# Patient Record
Sex: Male | Born: 1960 | Race: White | Hispanic: No | Marital: Married
Health system: Midwestern US, Academic
[De-identification: ages and names within clinical notes are randomized; demographics above are authoritative.]

---

## 2016-11-20 ENCOUNTER — Inpatient Hospital Stay
Admit: 2016-11-20 | Discharge: 2016-11-27 | Disposition: A | Discharge status: Home / Self Care | Payer: MEDICARE | Source: Other Acute Inpatient Hospital

## 2016-11-20 MED ORDER — GADOBENATE DIMEGLUMINE 529 MG/ML (0.1MMOL/0.2ML) IV SOLN
16 mL | Freq: Once | INTRAVENOUS | 0 refills | Status: CP
Start: 2016-11-20 — End: ?

## 2016-11-20 MED ORDER — PROCHLORPERAZINE EDISYLATE 5 MG/ML IJ SOLN
10 mg | INTRAVENOUS | 0 refills | Status: DC | PRN
Start: 2016-11-20 — End: 2016-11-27

## 2016-11-20 MED ORDER — IOPAMIDOL 76 % IV SOLN
100 mL | Freq: Once | INTRAVENOUS | 0 refills | Status: CP
Start: 2016-11-20 — End: ?

## 2016-11-20 MED ORDER — SENNOSIDES-DOCUSATE SODIUM 8.6-50 MG PO TAB
1 | Freq: Two times a day (BID) | ORAL | 0 refills | Status: DC
Start: 2016-11-20 — End: 2016-11-27

## 2016-11-20 MED ORDER — FENTANYL CITRATE (PF) 50 MCG/ML IJ SOLN
25-50 ug | INTRAVENOUS | 0 refills | Status: DC | PRN
Start: 2016-11-20 — End: 2016-11-25

## 2016-11-20 MED ORDER — DOCUSATE SODIUM 100 MG PO CAP
100 mg | Freq: Two times a day (BID) | ORAL | 0 refills | Status: DC
Start: 2016-11-20 — End: 2016-11-27

## 2016-11-20 MED ORDER — SODIUM CHLORIDE 0.9 % IJ SOLN
50 mL | Freq: Once | INTRAVENOUS | 0 refills | Status: CP
Start: 2016-11-20 — End: ?

## 2016-11-20 MED ORDER — ACETAMINOPHEN 325 MG PO TAB
650 mg | ORAL | 0 refills | Status: DC | PRN
Start: 2016-11-20 — End: 2016-11-25

## 2016-11-20 MED ORDER — OXYCODONE 5 MG PO TAB
5-10 mg | ORAL | 0 refills | Status: DC | PRN
Start: 2016-11-20 — End: 2016-11-25

## 2016-11-20 MED ORDER — DIPHENHYDRAMINE HCL 50 MG/ML IJ SOLN
50 mg | Freq: Once | INTRAVENOUS | 0 refills | Status: CP
Start: 2016-11-20 — End: ?

## 2016-11-20 MED ORDER — LISINOPRIL 20 MG PO TAB
20 mg | Freq: Every day | ORAL | 0 refills | Status: DC
Start: 2016-11-20 — End: 2016-11-26

## 2016-11-20 MED ORDER — PROCHLORPERAZINE EDISYLATE 5 MG/ML IJ SOLN
10 mg | Freq: Once | INTRAVENOUS | 0 refills | Status: CP
Start: 2016-11-20 — End: ?

## 2016-11-20 MED ORDER — MAGNESIUM HYDROXIDE 2,400 MG/10 ML PO SUSP
10 mL | Freq: Every day | ORAL | 0 refills | Status: DC
Start: 2016-11-20 — End: 2016-11-27
  Administered 2016-11-27: 13:00:00 10 mL via ORAL

## 2016-11-20 MED ORDER — MAGNESIUM SULFATE IN D5W 1 GRAM/100 ML IV PGBK
1 g | Freq: Once | INTRAVENOUS | 0 refills | Status: CP
Start: 2016-11-20 — End: ?

## 2016-11-20 NOTE — H&P (View-Only)
[facility]       History and Physical Note  _____________________________________________________________________    Admission Date: 11/20/2016                                                LOS: 0 days    Assessment/Plan:  Todd Fischer is a 56 y.o. male with HTN, HLD, COPD, 30 pack year smoking history presents as transfer with CT demonstrating right frontal mass and with left facial droop and reported confusion from family.    - no acute neurosurgical intervention  - admitted to floor  - MRI head W/WO to better characterize lesion  - CT c/a/p for possible primary tumor location  - q4 neuro checks  - discussed with senior resident  - ongoing planning depending imaging results    Ardeth Sportsman, MD    Please call Neurosurgery Call Pager [phone]) with questions.  _____________________________________________________________________    History of Present Illness:   Todd Fischer is a 56 y.o. male with hypertension, hyperlipidemia, COPD, and a 30-pack-year smoking history presents as a transfer from outside hospital.  Patient presented outside hospital with complaints of headache, left facial droop, and reports by family of being increasingly confused over the last month.  Outside hospital obtained CT head which demonstrated a right frontal mass.  Patient was then transferred to  for further care management. Patient reports that his headache started about 1 month ago, diffuse throughout his head.  Patient reports that he did not notice the left facial droop until 1-2 weeks ago.  Patient has also noticed within the last week and a slight difficulty with swallowing and drooling.  Per family, patient has been more forgetful last month forgetting to close the fridge and other daily tasks that the patient does not normally forget.  Patient denies any nausea, vomiting, numbness, weakness, tingling, or change in vision.      Past Medical History:  Past Medical History:   Diagnosis Date ??? Symptoms Involving Digestive System     reflux   ??? Unspecified Disease of Respiratory System     asthma/chronic bronchitis       Past Surgical History:  Past Surgical History:   Procedure Laterality Date   ??? HX TONSILLECTOMY         Social History:  Social History   Substance Use Topics   ??? Smoking status: Not on file   ??? Smokeless tobacco: Not on file   ??? Alcohol use Not on file       Family History:  No family history on file.    Allergies:    Patient has no known allergies.    Medications:  PTA:  Prescriptions Prior to Admission   Medication Sig   ??? fluticasone/salmeterol (ADVAIR DISKUS) 100/50 mcg inhalation disk RT Twice Daily    ??? hydrocodone-acetaminophen (LORTAB 10/500) 10-500 mg per tablet 2 tabs every 6 hours prn pain       Inpatient:  Scheduled Meds:  docusate (COLACE) capsule 100 mg 100 mg Oral BID   milk of magnesia (CONC) oral suspension 10 mL 10 mL Oral QDAY   senna/docusate (SENOKOT-S) tablet 1 tablet 1 tablet Oral BID   Continuous Infusions:  PRN and Respiratory Meds:acetaminophen Q4H PRN, fentaNYL citrate PF Q2H PRN, oxyCODONE Q4H PRN, prochlorperazine Q6H PRN      Review of Systems:  Full 12 point  review of systems negative except as noted in HPI.    Physical Exam:  Vital Signs:  Last Filed in 24 hours Vital Signs:  24 hour Range    BP: 197/100 (05/25 1559)  Temp: 36.2 ???C (97.2 ???F) (05/25 1559)  Pulse: 54 (05/25 1620)  Respirations: 18 PER MINUTE (05/25 1620)  SpO2: 99 % (05/25 1620)  O2 Delivery: None (Room Air) (05/25 1559)  Height: 182.9 cm (72) (05/25 1500) BP: (197)/(100)   Temp:  [36.2 ???C (97.2 ???F)]   Pulse:  [54-56]   Respirations:  [18 PER MINUTE]   SpO2:  [99 %]   O2 Delivery: None (Room Air)     General appearance: No acute distress  Lungs: Respirations non-labored  Heart: Regular rate and rhythm  Abdomen: Soft, non-tender. Non-distended  Extremities: No edema, redness or tenderness in the calves or thighs    Neurologic Exam: Mental Status: Awake, alert and oriented x 4, fluent speech, normal cognition  Pupils: Pupils equal round and reactive to light  Cranial Nerves: left lower facial droop, otherwise CN II-XII individually tested and found to be intact, gag not tested  Motor:   AA EF EE FA G HF KF KE DF PF EHL  Left 5 5 5 5 5 5 5 5 5 5 5    Right 5 5 5 5 5 5 5 5 5 5 5    Normal muscle bulk and tone  No pronator drift  Sensation: Sensation intact to light touch  Deep Tendon Reflexes:  2+ throughout  Plantar responses toes downgoing bilaterally  No clonus bilaterally  No hoffman's sign bilaterally  Gait: Normal gait  Cerebellar: No dysmetria, No dysdiadochokinesia      LabTests:  Hematology:    Lab Results   Component Value Date    HGB 15.4 11/20/2016    HCT 44.9 11/20/2016    PLTCT 207 11/20/2016    WBC 10.6 11/20/2016    NEUT 68 11/20/2016    ANC 7.20 11/20/2016    ALC 2.30 11/20/2016    MONA 9 11/20/2016    AMC 0.90 11/20/2016    ABC 0.00 11/20/2016    MCV 90.6 11/20/2016    MCHC 34.2 11/20/2016    MPV 8.6 11/20/2016    RDW 13.3 11/20/2016     General Chemistry: No results found for: NA, K, CL, CO2, BUN, CR, GLU, OBSCA, CA, MG, PO4, FREEPHENY      Radiology and other Diagnostics Review:    CT head reveiwed demonstrating right frontal mass.

## 2016-11-20 NOTE — Progress Notes
Pt arrived from [place]. Pt is alert and oriented x4. Able to ambulate with standby assist. Pt is weak to LUE only. Marland Kitchen Pt placed on high fall risk due to new diagnosis of brain mass. Pt is NPO per dr's orders. Pt oriented to room. Will cont. To monitor

## 2016-11-20 NOTE — Progress Notes
RESPIRATORY THERAPY  ADULT PROTOCOL EVALUATION      RESPIRATORY PROTOCOL PLAN    Medications       Note: If indicated by protocol, medication orders will be placed by therapist.    Procedures  PEP Therapy: Place a nursing order for "IS Q1h While Awake" for any of Lung Expansion indicators       _____________________________________________________________    PATIENT EVALUATION RESULTS    Chart Review  * Pulmonary Hx: Smoking cessation < 8 weeks OR still smoking OR > 20 pack/yr hx (PEFR)_____ OR occasional use of bronchodilator (AM) (current smoker)    * Surgical Hx: No surgery OR last surgery > 6 weeks ago OR trach/stoma (BA)    * Chest X-Ray: Clear OR not available (pending)    * PFT/Oxygenation: FEV1, PEFR > 80% predicted OR physically unable to perform OR Pa02 >80 RA OR Sp02 >95% RA (SpO2=100% on RA)      Patient Assessment  * Respiratory Pattern: Regular pattern and rate OR good chest excursion with deep breathing    * Breath Sounds: Clear apically, but diminished in bases (LE) OR CHF related crackles (02) (oximetry)    * Cough / Sputum: Strong, effective cough OR nonproductive    * Mental Status: Alert, oriented, cooperative    * Activity Level: Ambulatory with assistance      Priority Index  Total Points: 4 Points  * Priority Index: Criteria not met    PRIORITY INDEX GUIDELINES*  Priority Points   1 0-9 points   2 9-18 points   3 > 18 points   + Pulm Dx or Home Rx   *Higher points indicate higher acuity.      Therapist: Flint Melter, RT  Date: 11/20/2016      Key  AC = Airway clearance  AM = Aerosolized medication  BA = Bland aerosol  DB&C = Deep breathe & cough  FEV1 = Forced expiratory volume in first second)  IC = Inspiratory capacity  LE = Lung expansion  MDI = Metered dose inhaler  Neb = Nebulizer  O2 = Oxygen  Oxim =Oximetry  PEFR = Peak expiratory flow rate  RRT = Rapid Response Team

## 2016-11-21 MED ORDER — DEXAMETHASONE SODIUM PHOSPHATE 4 MG/ML IJ SOLN
4 mg | INTRAVENOUS | 0 refills | Status: DC
Start: 2016-11-21 — End: 2016-11-26

## 2016-11-21 NOTE — Consults
Hematology/Oncology Consult Note          Name:  Todd Fischer                                             MRN:  [id number]   Admission Date:  11/20/2016             Impression and Recommendations    Todd Fischer is a 56 y.o. male with past medical history of current and heavy tobacco use, COPD, previous alcohol use who was transferred from outside hospital for further up.     1. 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 Indeterminate 3cm liver lesion  3. RUL nodule 1cm with mild hilar adenopathy    DDx: primary brain neoplasm like GBM vs metastasis from a possible lung primary SCLC vs NSCLC vs melanoma vs other differential    Recommendations  Would recommend MRI abdomen and AFP to further work up the liver lesion.   We could obtain PET and EBUS for the lung nodule and hilar adenopathy work up but we feel that his frontal lobe lesion is quite big with associated midline shift, which will likely to be managed better with surgical resection. So if you plan to proceed with surgical resection, we can get pathology from their and develop a plan moving forward,   I discussed these recommendations with resident on call with neurosurgery.    Patient was seen and discussed with Dr. Raleigh Callas.     Joycelyn Man MBBS  Pager: [phone]  Fellow/Hematology and Medical Oncology  [facility]    ___________________________________________________________________________  Reason for consult: Brain mass     HISTORY OF PRESENT ILLNESS: Todd Fischer is a 56 y.o. male with past medical history of current and heavy tobacco use, COPD, previous alcohol use who was transferred from outside hospital for further up.     Patient presented to outside hospital with complaints of headache, left facial droop, and increasing confusion over the course of the last month.  Outside hospital obtained CT head which demonstrated a right frontal mass. Patient was then transferred to [facility] for further care management. His sister is present at bedside and is taking the lead on answering questions. Patient appreciated my presence but has difficulty expressing himself and motioned his sister to respond to mu questions multiple times. Sister told us that the patient has not been feeling at the baseline for about 1 month now. Initially he had facial drooping so he was diagnosed with Bell's palsy and started on steroids. During this process, they were seen by a neurologist as well who recommended MRI but that was not done yet. Patine then developed a headache and then family noticed that he was mote confused. He has been more forgetful last month as well. They noticed some personality changes as well.     He was transferred to [facility] where MRI showed a large 5.1cm R frontal lobe mass with associated midline shift of about .5cm.     he has significant smoking hx. He still smokes. 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 ???No history of any infectious hepatitis that he or she knows about. Only family hx of cancer is his father who died of lung cancer but he was a heavy smoker as well.  PMH:  Past Medical History:   Diagnosis Date   ??? Symptoms Involving Digestive System     reflux   ??? Unspecified Disease of Respiratory System     asthma/chronic bronchitis        PSH:  Past Surgical History:   Procedure Laterality Date   ??? HX TONSILLECTOMY          SOCIAL HISTORY:  Social History     Social History   ??? Marital status: Married     Spouse name: N/A   ??? Number of children: N/A   ??? Years of education: N/A     Social History Main Topics   ??? Smoking status: Not on file   ??? Smokeless tobacco: Not on file   ??? Alcohol use Not on file   ??? Drug use: Unknown   ??? Sexual activity: Not on file     Other Topics Concern   ??? Not on file     Social History Narrative   ??? No narrative on file        FAMILY HISTORY:  No family history on file.     IMMUNIZATIONS: There is no immunization history on file for this patient.        ALLERGIES:  Patient has no known allergies.    HOME MEDICATIONS:  Prescriptions Prior to Admission   Medication Sig   ??? aspirin EC 81 mg tablet Take 81 mg by mouth at bedtime daily. Take with food.   ??? diphenhydrAMINE (BENADRYL ALLERGY) 25 mg tablet Take 25 mg by mouth at bedtime as needed.   ??? guaiFENesin LA (MUCINEX) 600 mg tablet Take 600 mg by mouth at bedtime daily.   ??? krill/om-3/dha/epa/phospho/ast (MEGARED OMEGA-3 KRILL OIL PO) Take 1 capsule by mouth daily.   ??? lisinopril (PRINIVIL; ZESTRIL) 20 mg tablet Take 20 mg by mouth at bedtime daily.   ??? ranitidine (ZANTAC) 75 mg tablet Take 75 mg by mouth at bedtime daily.   ??? simvastatin (ZOCOR) 40 mg tablet Take 40 mg by mouth at bedtime daily.   ??? vitamins, multiple (MULTIPLE VITAMINS) tablet Take 1 tablet by mouth at bedtime daily.        ROS:  ??? Could not be obtained due to patient factors       PHYSICAL EXAM:  Vital Signs: Last Filed In 24 Hours   BP: 146/85 (05/26 0822)  Temp: 36.7 ???C (98 ???F) (05/26 5188)  Pulse: 89 (05/26 0822)  Respirations: 18 PER MINUTE (05/26 0822)  SpO2: 98 % (05/26 0822)  O2 Delivery: None (Room Air) (05/26 4166)  Height: 182.9 cm (72) (05/25 1500)     ??? General: Awake, in no acute distress, long word finding pauses and then motions his sister to answer the questions .  ??? HEENT: NC/AT, sclera non-icteric, moist mucus membranes, clear oropharynx  ??? Neck: no JVD, no carotid bruit  ??? Lymph Nodes: Not palpable   ??? CV: regular rhythm, normal rate, no murmur/rub/gallop  ??? Lungs: clear to ausculation bilaterally, no wheezes/rhonchi/crackles  ??? Abdomen: soft, non-tender, non-distended, normo-active bowel sounds  ??? Hepatosplenomegaly: No  ??? Extremities: no edema, pedal pulses 2 (+) bilaterally  ??? Neuro: no focal deficits  ??? Skin: no rashes    LABS:  Recent CBC   Recent Labs      11/20/16   1715   WBC  10.6   HGB  15.4   HCT  44.9   PLTCT  207   MCV  90.6  INR  1.1 Recent CMP   Recent Labs      11/20/16   1715   NA  135*   K  4.0   CL  104   CO2  19*   GAP  12   CR  1.02   GFR  >60   GLU  109*   CA  10.4          RADIOLOGY:    Pertinent radiology reviewed.    Joycelyn Man MBBS  Pager: [phone]  Fellow/Hematology and Medical Oncology  [facility]

## 2016-11-21 NOTE — Progress Notes
Neurosurgery Progress Note      Admission Date: 11/20/2016 LOS: 0 days  ______________________________________________________________    S: No acute events noted  Continued work up discussed with patient and sister.         O:                  Vital Signs: 24 Hour Range   BP: (132-197)/(79-111)   Temp:  [36.2 C (97.2 F)-37.1 C (98.8 F)]   Pulse:  [52-96]   Respirations:  [18 PER MINUTE]   SpO2:  [98 %-99 %]   O2 Delivery: (P) None (Room Air)     Physical Exam:  Alert and Oriented  Approrpriate conversation  Left facial droop  Right strength 5/5 throughout  Left 4/5 throughout        A/P: 56 y.o. male   Active Problems:    Brain mass  [employer] is a 56 y.o. male with HTN, HLD, COPD, 30 pack year smoking history presents as transfer with CT demonstrating right frontal mass and with left facial droop and reported confusion from family.    MRI brian done - reviewed  CT c/a/p - fluid filled bullous noted - recommended aspiration with analysis   Right upper lobe nodule - recommend PET or short term CT follow up 2-3 months  Right liver lesion - MRI recommended  Plan to consult ONc for further work up assistance  Will talk to IR as well  Continue rest of current care for now  Dispo - pending     Prophylaxis:   B) Lines:  No  C) Urinary Catheter:  No  D) Antibiotic Usage:  No  E) VTE:  Mechanical prophylaxis; Sequential compression device  F) Restraints: Patient assessed for need for restraints.     Please call [phone] with any questions.    Bernerd Limbo, MD  Pager [phone]

## 2016-11-22 MED ORDER — MAGNESIUM SULFATE IN D5W 1 GRAM/100 ML IV PGBK
1 g | Freq: Once | INTRAVENOUS | 0 refills | Status: CP
Start: 2016-11-22 — End: ?

## 2016-11-22 MED ORDER — LABETALOL 5 MG/ML IV SYRG
10 mg | Freq: Once | INTRAVENOUS | 0 refills | Status: CP
Start: 2016-11-22 — End: ?

## 2016-11-22 MED ORDER — HYDRALAZINE 20 MG/ML IJ SOLN
10 mg | INTRAVENOUS | 0 refills | Status: DC | PRN
Start: 2016-11-22 — End: 2016-11-25

## 2016-11-22 MED ORDER — DIPHENHYDRAMINE HCL 50 MG/ML IJ SOLN
50 mg | Freq: Once | INTRAVENOUS | 0 refills | Status: CP
Start: 2016-11-22 — End: ?

## 2016-11-22 MED ORDER — GADOXETATE 2.5 MMOL/10 ML (181.43 MG/ML) IV SOLN
10 mL | Freq: Once | INTRAVENOUS | 0 refills | Status: CP
Start: 2016-11-22 — End: ?

## 2016-11-22 MED ORDER — LABETALOL 5 MG/ML IV SYRG
10 mg | INTRAVENOUS | 0 refills | Status: DC | PRN
Start: 2016-11-22 — End: 2016-11-25

## 2016-11-22 MED ORDER — SODIUM CHLORIDE 0.9 % IJ SOLN
50 mL | Freq: Once | INTRAVENOUS | 0 refills | Status: CP
Start: 2016-11-22 — End: ?

## 2016-11-22 NOTE — Progress Notes
PHYSICAL THERAPY  NOTE     Physical therapy orders received and appreciated. Chart reviewed and discussed patient status with bedside RN. Patient currently ambulating in room with family members (SBA). Per RN, plan for potential surgery on Wednesday 05/30. Physical therapy will follow up post-operatively and provide intervention as indicated.     Therapist: Talmadge Coventry, PT  Date: 11/22/2016

## 2016-11-22 NOTE — Progress Notes
Text paged neurosurgery regarding pt's blood pressure. Pt asymptomatic. Pt has no pain. No PRN HTN meds to give.

## 2016-11-22 NOTE — Progress Notes
Neurosurgery Progress Note      Admission Date: 11/20/2016 LOS: 2 days  ______________________________________________________________    S: No acute events noted, family feels speech has improved with Dex.   Discussed plan with family. Will review case with Dr. Enrique Sack.         O:                  Vital Signs: 24 Hour Range   BP: (144-175)/(77-101)   Temp:  [36.4 C (97.5 F)-36.7 C (98 F)]   Pulse:  [72-95]   Respirations:  [18 PER MINUTE-20 PER MINUTE]   SpO2:  [96 %-99 %]   O2 Delivery: None (Room Air)     Physical Exam:  Alert and Oriented  Approrpriate conversation  Left facial droop  Right strength 5/5 throughout  Left 4/5 throughout        A/P: 56 y.o. male   Active Problems:    Brain mass  Todd Fischer is a 56 y.o. male with HTN, HLD, COPD, 30 pack year smoking history presents as transfer with CT demonstrating right frontal mass and with left facial droop and reported confusion from family.    MRI brian done - reviewed  CT c/a/p - fluid filled bullous noted - recommended aspiration with analysis (hold for now as asymptomatic - will consult IR on Tuesday)   Right upper lobe nodule - recommend PET or short term CT follow up 2-3 months  Right liver lesion - MRI ordered   Onc following - appreciate recs   Continue rest of current care for now  Dispo - pending     Prophylaxis:   B) Lines:  No  C) Urinary Catheter:  No  D) Antibiotic Usage:  No  E) VTE:  Mechanical prophylaxis; Sequential compression device  F) Restraints: Patient assessed for need for restraints.     Please call [phone] with any questions.    Bernerd Limbo, MD  Pager [phone]

## 2016-11-22 NOTE — Progress Notes
[id number]  Neurosurgery notified of elevated BP and request for migraine cocktail.  Order received for cocktail and to monitor BP.

## 2016-11-22 NOTE — Progress Notes
OCCUPATIONAL THERAPY  NOTE     OT orders received and appreciated. Chart reviewed and discussed status with RN. Patient currently ambulating in room with family members. Per RN, plan for potential surgery on Wednesday 05/30. OT will follow up post-operatively.      Therapist: Arrie Aran, OTR/L [id number]  Date: 11/22/2016

## 2016-11-23 MED ORDER — HEPARIN, PORCINE (PF) 5,000 UNIT/0.5 ML IJ SYRG
5000 [IU] | SUBCUTANEOUS | 0 refills | Status: DC
Start: 2016-11-23 — End: 2016-11-23

## 2016-11-23 MED ORDER — HEPARIN, PORCINE (PF) 5,000 UNIT/0.5 ML IJ SYRG
5000 [IU] | SUBCUTANEOUS | 0 refills | Status: CP
Start: 2016-11-23 — End: ?

## 2016-11-23 MED ORDER — MAGNESIUM SULFATE IN D5W 1 GRAM/100 ML IV PGBK
1 g | Freq: Once | INTRAVENOUS | 0 refills | Status: CP
Start: 2016-11-23 — End: ?

## 2016-11-23 MED ORDER — DIPHENHYDRAMINE HCL 50 MG PO CAP
50 mg | Freq: Once | ORAL | 0 refills | Status: CP
Start: 2016-11-23 — End: ?

## 2016-11-23 NOTE — Progress Notes
11/23/16 1248   Vitals   Temp 36.7 C (98.1 F)   Temperature Source Oral   Pulse 86   Respirations 18 PER MINUTE   SpO2 98 %   O2 Delivery RA   BP (!) 181/99   Mean NBP (Calculated) 126 MM HG   BP Source Arm, Left   BP Patient Position HOB   1245: BP 181/99 - 10 mg labetalol given.  1350: BP 172/99 - 10 mg hydralazine given  1440: BP 164/86  Notified MD Alease Medina of above, no new orders given at this time. Will continue to monitor.

## 2016-11-23 NOTE — Progress Notes
Neurosurgery Progress Note      Admission Date: 11/20/2016 LOS: 3 days  ______________________________________________________________    S: No acute events noted, doing well this AM.  speech still improved.  Discussed plan for OR on Wednesday.  Will meet Dr. Enrique Sack tomorrow.          O:                  Vital Signs: 24 Hour Range   BP: (139-183)/(72-93)   Temp:  [36.2 C (97.1 F)-36.7 C (98 F)]   Pulse:  [63-82]   Respirations:  [17 PER MINUTE-18 PER MINUTE]   SpO2:  [98 %-100 %]   O2 Delivery: None (Room Air)     Physical Exam:  Alert and Oriented  Approrpriate conversation  Left facial droop  Right strength 5/5 throughout  Left 4/5 throughout        A/P: 56 y.o. male   Active Problems:    Brain mass  Todd Fischer is a 56 y.o. male with HTN, HLD, COPD, 30 pack year smoking history presents as transfer with CT demonstrating right frontal mass and with left facial droop and reported confusion from family.    MRI brian done - reviewed  CT c/a/p - fluid filled bullous noted - recommended aspiration with analysis (hold for now as asymptomatic - will consult IR on Tuesday)   Right upper lobe nodule - recommend PET or short term CT follow up 2-3 months  Right liver lesion - MRI done no read yet  Onc following - appreciate recs   Continue rest of current care for now  Plan for OR Wednesday awake crani   Dispo - pending     Prophylaxis:   B) Lines:  No  C) Urinary Catheter:  No  D) Antibiotic Usage:  No  E) VTE:  Mechanical prophylaxis; Sequential compression device  F) Restraints: Patient assessed for need for restraints.     Please call [phone] with any questions.    Bernerd Limbo, MD  Pager [phone]

## 2016-11-24 MED ORDER — ALPRAZOLAM 0.25 MG PO TAB
0.25 mg | ORAL | 0 refills | Status: DC | PRN
Start: 2016-11-24 — End: 2016-11-26

## 2016-11-24 MED ORDER — AMLODIPINE 5 MG PO TAB
5 mg | Freq: Every day | ORAL | 0 refills | Status: DC
Start: 2016-11-24 — End: 2016-11-26

## 2016-11-24 NOTE — Progress Notes
SPEECH-LANGUAGE PATHOLOGY  COGNITIVE ASSESSMENT     EVALUATION SUMMARY  Montreal Cognitive Assessment [facility]) completed this date. 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 Areas of difficulty included:delayed recall/memory and attention. Pt additionally noted to have increased processing time and flat affect. Pt's sister-in-law present at time of visit. Pt/family both report changes to cognition at this time. Please see further details below.     RECOMMENDATIONS:  Will f/u with pt Thursday or Friday following planned crani tomorrow. At this time would recommend ongoing speech therapy services to address cognitive-communicative changes. Intermittent assist w/ finance & medication management and meal preparation upon discharge secondary to patient's impaired attention and memory.      PRAGMATICS   Comments*: Appropriate eye contact with flat affect.    BEHAVIOR  Comments*:Increased processing time and need for repetition of information noted.     AUDITORY COMPREHENSION   Comments*: No focal language deficits noted.     MOCA-Montreal Cognitive Assessment  Visuospatial/executive: 4/5  Naming:3/3  Memory/Delayed Recall:5/5 with cues immediately and 4/5 with a delay.   Attention: 3/6  Language:3/3  Abstraction: 2/2  Orientation:6/6    Objective*  Todd Fischer???is a 56 y.o.???male???with HTN, HLD, COPD, 30 pack year smoking history presents as transfer with CT demonstrating right frontal mass and with left facial droop and reported confusion from family.    MRI head:  IMPRESSION  Redemonstration of the large right frontal lobe mass with small areas of   hemorrhage, peripheral enhancement and central necrosis. This results in   mild right to left midline shift.    Handedness: Right  Hearing: WFL  Education Level: 9th grade  Lives With: wife  Receives Help From: wife; has been on disability   Psychosocial Status: Willing and Cooperative to Participate  Persons Present: Sister-in-law    Subjective* Pain: Patient has no complaint of pain  Pain Level Current*: No pain  Feeding Tube Present During Eval: None    Education*  Persons Educated: Equities trader  Barriers To Learning: memory changes  Interventions: Repetition of Instructions  Teaching Methods: Verbal  Topics: Memory  Patient Response: Verbalized Understanding  Goal Formulation: With Patient    Pt will participate in an ongoing cognitive evaluation with moderate cues.     G-Codes:Memory   G 9168 Current Status:20-39% Impairment  G 9169 Goal Status:20-39% Impairment      Based on above evaluation and clinical judgment.       Therapist: Cheryl Flash - M.S. CCC-L/SLP   [id number]  [id number]  Date: 11/24/2016

## 2016-11-24 NOTE — Progress Notes
11/24/16 1255   Vitals   Temp 36.6 C (97.9 F)   Temperature Source Oral   Pulse 62   Respirations 18 PER MINUTE   SpO2 98 %   O2 Delivery RA   BP (!) 189/92   Mean NBP (Calculated) 124 MM HG   BP Source Arm, Right   BP Patient Position HOB     APRN Labelle notified of above.  Pt very anxious regarding surgery tmrw and would like "something to calm him down".  APRN to place orders.  APRN also asked this RN to not give hydralazine, to give anxiety meds and then recheck BP.  Will continue to monitor.

## 2016-11-24 NOTE — Progress Notes
Neurosurgery Progress Note      Admission Date: 11/20/2016 LOS: 4 days  ______________________________________________________________    S: Doing well this AM, family at bedside, seen with Dr. Enrique Sack, surgical plan discussed, questions answered. Patient in agreement with awake crani.        O:                  Vital Signs: 24 Hour Range   BP: (135-181)/(70-99)   Temp:  [36.4 C (97.6 F)-36.7 C (98.1 F)]   Pulse:  [62-91]   Respirations:  [18 PER MINUTE-20 PER MINUTE]   SpO2:  [96 %-99 %]   O2 Delivery: None (Room Air)     Physical Exam:  Alert and Oriented  Approrpriate conversation  Left facial droop, with drooling  Right strength 5/5 throughout  Left 4/5 throughout x grip 3+/5        A/P: 56 y.o. male   Active Problems:    Brain mass  Arthur Speagle is a 56 y.o. male with HTN, HLD, COPD, 30 pack year smoking history presents as transfer with CT demonstrating right frontal mass and with left facial droop and reported confusion from family.    MRI brian done - reviewed  CT c/a/p - fluid filled bullous noted - Right upper lobe nodule - recommend PET or short term CT follow up 2-3 months  Right liver lesion - MRI read pending  Onc following - appreciate recs, anticipate need for PET prior to discharge  Continue rest of current care for now  Plan for OR 5/30 awake crani   Dispo - pending     Prophylaxis:   B) Lines:  No  C) Urinary Catheter:  No  D) Antibiotic Usage:  No  E) VTE:  Mechanical prophylaxis; Sequential compression device  F) Restraints: Patient assessed for need for restraints.     Please call [phone] with any questions.    Delfin Gant, APRN  Pager [phone]

## 2016-11-24 NOTE — Case Management (ED)
** NCM assisting primary NCM Allegra with discharge planning. Please contact primary NCM for any additional assistance. Thank you.**    Case Management Admission Assessment    NAME:Todd Fischer                          MRN: [id number]             DOB:12-Oct-1960          AGE: 56 y.o.  ADMISSION DATE: 11/20/2016             DAYS ADMITTED: LOS: 4 days      Today???s Date: 11/24/2016    Source of Information: Patient, Other Sister-in-Law Debra and EMR    Plan  Plan: CM Assessment, Assist PRN with SW/NCM Services  Per H&P, 56 y.o. male with HTN, HLD, COPD, 30 pack year smoking history presents as transfer with CT demonstrating right frontal mass and with left facial droop and reported confusion from family.Plan for OR 5/30 awake crani.  Patient is laying in bed and resting comfortably at time of NCM assessment; agreeable to completing assessment at this time.   ??? Met with patient at bedside; explained role of NCM and case management's role in discharge planning; provided patient with NCM contact information.   ??? Patient lives in a 1 story home with his wife. Pt reports that his wife is an opiate nurse. Pt also reports that his wife was just released from the hospital yesterday following a procedure at a different hospital.   ??? Patient denies previous HH, LTACH, SNF, IPR, DME experience, and is unsure of whether he will require [facility] services upon discharge.   ??? Neurosurgery CM team to continue to follow patient's POC via EMR and primary team huddle; will await final therapy recommendations post-op; will assist with discharge planning needs as indicated.      Patient Address/Phone  [address]  [place] [address]  [phone] (home)     Emergency Contact  Extended Emergency Contact Information  Primary Emergency Contact: Manalo,Darlene  Address: [address]           [place], [place] [address]  Home Phone: [phone]  Relation: Spouse  Secondary Emergency Contact: Marvell Fuller  Address: [place] Home Phone: [phone]  Relation: Relative    Healthcare Directive   Denies      Transportation  Does the patient need discharge transport arranged?: No  Transportation Name, Phone and Availability #1: Pt's wife will drive him home  Does the patient use Medicaid Transportation?: No    Primary Emergency Contact: Laiche,Darlene, Home Phone: [phone] will drive patient home upon discharge.     Expected Discharge Date  Expected Discharge Date: 11/27/16  ??? Expected discharge date pending patient's medical stability; awaiting surgical procedure tomorrow.     Living Situation Prior to Admission  ? Living Arrangements  Type of Residence: Home, independent  Living Arrangements: Spouse/significant other  Financial risk analyst / Tub: Tub/Shower Unit  How many levels in the residence?: 1 (2 steps)  Can patient live on one level if needed?: Yes  Does residence have entry and/or side stairs?: Yes (2 steps for entry)  Assistance needed prior to admit or anticipated on discharge:  (Unknown)  Who provides assistance or could if needed?: Pt's wife is a Engineer, civil (consulting); per sister in law, pt has adequate support systems in place to help him at home.   Are they in good health?: Unknown  Can support system provide  24/7 care if needed?: Maybe  ? Level of Function   Prior level of function: Independent   ? Cognitive Abilities   Cognitive Abilities: Alert and Oriented, Participates in decision making, Recognizes impact of health condition on lifestyle, Continue to Assess    Financial Resources  ? Coverage  Primary Insurance: Medicare Replacement ([employer] Medicare)  Secondary Insurance: No insurance  Additional Coverage: RX      [facility] [place], [place] - [address] [place] [address]  Phone: [phone] Fax: [phone]    Payor: Francine Graven MEDICARE / Plan: HU[employer] CHOICE PPO / Product Type: Medicare /   ? Source of Income   Source Of Income: Unemployed, Other (comment) (Pt is on long-term disability.)  ? Financial Assistance Needed? Denies need for financial assistance including Rx coverage    Psychosocial Needs  ? Mental Health  Mental Health History: No  ? Substance Use History  Substance Use History Screen: No  ? Other  - Pt would like a consult placed to the smoking cessation team; primary NCM notified.     Current/Previous Services  ? PCP  Fawn Kirk, [phone], [phone]  ? Pharmacy    [facility] [address] [place] - [address] [place] [address] [place] [address]  [place] [address]  Phone: [phone] Fax: [phone]    ? Durable Medical Equipment   Durable Medical Equipment at home: Nebulizer  ? Home Health  Receiving home health: No  ? Hemodialysis or Peritoneal Dialysis  Undergoing hemodialysis or peritoneal dialysis: No  ? Tube/Enteral Feeds  Receive tube/enteral feeds: No  ? Infusion  Receive infusions: No  ? Private Duty  Private duty help used: No  ? Home and Community Based Services  Home and community based services: No  ? [employer] Supply: N/A  ? Hospice  Hospice: No  ? Outpatient Therapy  PT: No  OT: No  SLP: No  ? Skilled Nursing Facility/Nursing Home  SNF: No  NH: No  ? Inpatient Rehab  IPR: No  ? Long-Term Acute Care Hospital  LTACH: No  ? Acute Hospital Stay  Acute Hospital Stay: No      Barrie Lyme BSN, RN  Nurse Case Manager  O: [phone]  P: [phone]  [email or web address]

## 2016-11-25 MED ORDER — NICARDIPINE IN NACL (ISO-OS) 20 MG/200 ML IV PGBK
5-15 mg/h | INTRAVENOUS | 0 refills | Status: DC
Start: 2016-11-25 — End: 2016-11-26

## 2016-11-25 MED ORDER — THROMBIN (BOVINE) 5,000 UNIT TP SOLR
0 refills | Status: DC
Start: 2016-11-25 — End: 2016-11-26

## 2016-11-25 MED ORDER — LABETALOL 5 MG/ML IV SYRG
10-20 mg | INTRAVENOUS | 0 refills | Status: DC
Start: 2016-11-25 — End: 2016-11-25

## 2016-11-25 MED ORDER — ONDANSETRON HCL (PF) 4 MG/2 ML IJ SOLN
0 refills | Status: DC
Start: 2016-11-25 — End: 2016-11-25

## 2016-11-25 MED ORDER — SODIUM CHLORIDE 0.9 % IV SOLP
0 refills | Status: DC
Start: 2016-11-25 — End: 2016-11-25

## 2016-11-25 MED ORDER — SODIUM CHLORIDE 0.9 % FLUSH
0 refills | Status: DC
Start: 2016-11-25 — End: 2016-11-26

## 2016-11-25 MED ORDER — LIDOCAINE-EPINEPHRINE 1 %-1:100,000 IJ SOLN
0 refills | Status: DC
Start: 2016-11-25 — End: 2016-11-25

## 2016-11-25 MED ORDER — BACITRACIN ZINC 500 UNIT/GRAM TP OINT
0 refills | Status: DC
Start: 2016-11-25 — End: 2016-11-25

## 2016-11-25 MED ORDER — DIPHENHYDRAMINE HCL 50 MG/ML IJ SOLN
25 mg | Freq: Once | INTRAVENOUS | 0 refills | Status: AC | PRN
Start: 2016-11-25 — End: ?

## 2016-11-25 MED ORDER — FENTANYL CITRATE (PF) 50 MCG/ML IJ SOLN
50 ug | INTRAVENOUS | 0 refills | Status: DC | PRN
Start: 2016-11-25 — End: 2016-11-26

## 2016-11-25 MED ORDER — DEXMEDETOMIDINE 4 MCG/ML (INFUSION)(AM)(OR)
0 refills | Status: DC
Start: 2016-11-25 — End: 2016-11-25

## 2016-11-25 MED ORDER — ROPIVACAINE (PF) 5 MG/ML (0.5 %) IJ SOLN
0 refills | Status: DC
Start: 2016-11-25 — End: 2016-11-25

## 2016-11-25 MED ORDER — MANNITOL 20 % 20 % IV SOLP
0 refills | Status: DC
Start: 2016-11-25 — End: 2016-11-25

## 2016-11-25 MED ORDER — LEVETIRACETAM IN NACL (ISO-OS) 500 MG/100 ML IV PGBK
500 mg | Freq: Two times a day (BID) | INTRAVENOUS | 0 refills | Status: DC
Start: 2016-11-25 — End: 2016-11-26

## 2016-11-25 MED ORDER — HYDRALAZINE 20 MG/ML IJ SOLN
10-20 mg | INTRAVENOUS | 0 refills | Status: DC | PRN
Start: 2016-11-25 — End: 2016-11-26

## 2016-11-25 MED ORDER — PROPOFOL 10 MG/ML IV EMUL 50 ML (INFUSION)(AM)(OR)
0 refills | Status: DC
Start: 2016-11-25 — End: 2016-11-25

## 2016-11-25 MED ORDER — FENTANYL CITRATE (PF) 50 MCG/ML IJ SOLN
25-50 ug | INTRAVENOUS | 0 refills | Status: DC | PRN
Start: 2016-11-25 — End: 2016-11-26

## 2016-11-25 MED ORDER — ACETAMINOPHEN 325 MG PO TAB
650 mg | ORAL | 0 refills | Status: DC | PRN
Start: 2016-11-25 — End: 2016-11-27
  Administered 2016-11-27 (×2): 650 mg via ORAL

## 2016-11-25 MED ORDER — DEXAMETHASONE SODIUM PHOSPHATE 10 MG/ML IJ SOLN
0 refills | Status: DC
Start: 2016-11-25 — End: 2016-11-25

## 2016-11-25 MED ORDER — PROPOFOL INJ 10 MG/ML IV VIAL
0 refills | Status: DC
Start: 2016-11-25 — End: 2016-11-25

## 2016-11-25 MED ORDER — OXYCODONE 5 MG PO TAB
5-10 mg | ORAL | 0 refills | Status: DC | PRN
Start: 2016-11-25 — End: 2016-11-27
  Administered 2016-11-27 (×2): 5 mg via ORAL
  Administered 2016-11-27: 11:00:00 10 mg via ORAL

## 2016-11-25 MED ORDER — DEXMEDETOMIDINE# 4MCG/ML IV SOLN
0 refills | Status: DC
Start: 2016-11-25 — End: 2016-11-25

## 2016-11-25 MED ORDER — PROMETHAZINE 25 MG/ML IJ SOLN
6.25 mg | INTRAVENOUS | 0 refills | Status: DC | PRN
Start: 2016-11-25 — End: 2016-11-26

## 2016-11-25 MED ORDER — CEFAZOLIN INJ 1GM IVP
2 g | INTRAVENOUS | 0 refills | Status: CP
Start: 2016-11-25 — End: ?

## 2016-11-25 MED ORDER — FENTANYL CITRATE (PF) 50 MCG/ML IJ SOLN
25 ug | INTRAVENOUS | 0 refills | Status: DC | PRN
Start: 2016-11-25 — End: 2016-11-26

## 2016-11-25 MED ORDER — FENTANYL CITRATE (PF) 50 MCG/ML IJ SOLN
0 refills | Status: DC
Start: 2016-11-25 — End: 2016-11-25

## 2016-11-25 MED ORDER — SODIUM CHLORIDE 0.9 % IV SOLP
1000 mL | INTRAVENOUS | 0 refills | Status: DC
Start: 2016-11-25 — End: 2016-11-26

## 2016-11-25 MED ORDER — LABETALOL 5 MG/ML IV SYRG
10-20 mg | INTRAVENOUS | 0 refills | Status: DC | PRN
Start: 2016-11-25 — End: 2016-11-26

## 2016-11-25 MED ORDER — BACITRACIN 50,000 UN LR 500 ML IRR BOT (OR)
0 refills | Status: DC
Start: 2016-11-25 — End: 2016-11-25

## 2016-11-25 MED ORDER — LEVETIRACETAM IVPB
0 refills | Status: DC
Start: 2016-11-25 — End: 2016-11-25

## 2016-11-25 MED ORDER — CEFAZOLIN 1 GRAM IJ SOLR
0 refills | Status: DC
Start: 2016-11-25 — End: 2016-11-25

## 2016-11-25 NOTE — Case Management (ED)
Case Management Progress Note    NAME:Gregg Baisley                          MRN: [id number]              DOB:16-Jan-1961          AGE: 56 y.o.  ADMISSION DATE: 11/20/2016             DAYS ADMITTED: LOS: 5 days      Todays Date: 11/25/2016    Plan  Ongoing DC planning - Smoking cessation    Interventions  ? Support      ? Info or Referral   NCM let NS team know patient is interested in smoking cessation - NCM placed consult for [employer]; NS team to consider potential medication interventions for tobacco cessation.  ? Discharge Planning      ? Medication Needs      ? Financial      ? Legal      ? Other        Disposition  ? Expected Discharge Date    Expected Discharge Date: 11/27/16  ? Transportation   Does the patient need discharge transport arranged?: No  Transportation Name, Phone and Availability #1: Pt's wife will drive him home  Does the patient use Medicaid Transportation?: No  ? Next Level of Care (Acute Psych discharges only)      ? Discharge Disposition                                          Durable Medical Equipment     No service has been selected for the patient.      [facility]     No service has been selected for the patient.      [facility]     No service has been selected for the patient.      [facility]     No service has been selected for the patient.        Clyda Hurdle, BSN, Production designer, theatre/television/film  [phone]

## 2016-11-25 NOTE — Interval H&P Note
History and Physical Update Note    Allergies:  Patient has no known allergies.    Lab/Radiology/Other Diagnostic Tests:  24-hour labs:    Results for orders placed or performed during the hospital encounter of 11/20/16 (from the past 24 hour(s))   TYPE & CROSSMATCH    Collection Time: 11/24/16 11:20 AM   Result Value Ref Range    Units Ordered 2     Crossmatch Expires 11/27/2016     Record Check 2ND TYPE REQUIRED     ABO/RH(D) O POS     Antibody Screen NEG     Electronic Crossmatch YES    BLOOD TYPE CONFIRMATION - ORDER ONLY IF REQUESTED BY LAB    Collection Time: 11/24/16  3:24 PM   Result Value Ref Range    ABO/RH(D) O POS      Point of Care Testing:  (Last 24 hours):        I have examined the patient, and there are no significant changes in their condition, from the previous H&P performed on 11/20/2016.    Joya Salm, MD  Pager     --------------------------------------------------------------------------------------------------------------------------------------------

## 2016-11-25 NOTE — Other
Brief Operative Note    Name: Kainen Struckman is a 56 y.o. 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    Estimated Blood Loss: 100 mL    Specimen(s) Removed/Disposition:   ID Type Source Tests Collected by Time Destination   1 : Scalp Mask for Routine  Tissue Brain SURGICAL PATHOLOGY          Joya Salm, MD 11/25/2016 [phone]    2 : 2. RIGHT FRONTAL TUMOR Tissue Brain SURGICAL PATHOLOGY          Joya Salm, MD 11/25/2016 1042    3 : 3. RIGHT FRONTAL BRAIN TUMOR Tissue Brain SURGICAL PATHOLOGY          Joya Salm, MD 11/25/2016 9811        Complications:  None    Implants: KLS Martin cranial plating system    Drains: Foley Catheter: - mL    Disposition:  ICU - stable    Karene Fry, MD  Pager [phone]

## 2016-11-25 NOTE — Consults
Neuro Critical Care Consult       Todd Fischer  Admission Date: 11/20/2016  LOS: 5 days  Prior                      ASSESSMENT/PLAN     Patient Active Problem List    Diagnosis Date Noted   ??? 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 male with HTN, HLD, COPD and significant smoking history presents post operatively after resection of a large left front bran lesion.    Hospital and ICU course:   5/30: admitted to ICU s/p right sided awake crani, doing well    Neuro: Right frontal mass, s/p awake right crani  - Neuro intact besides baseline left sided facial droop  - Continue Dex 4q6  - Repeat MRI tomorrow AM  - Q1h checks, keppra 500mg  BID    Sedation/Pain Management: Yes  - Complains of some mild right sided head pan  - PRN tylenol 650mg  Q4h, fentanyl 25-39mcg q1h, oxy 5-10mg  q3h  - Assess for delirium daily    Cardiac: HTN, HLD  - SBP goal <140  - MAP goal > 65  - Restart home lisinopril 20mg  QHS,   - Holding simvastatin 40mg  QHS and ASA 81mg  QD    Respiratory:  COPD, RUL nodule  - No acute concerns  - Breathing room air without distress  - Continue to monitor    GI: No acute concerns  - Feeding: advance as tolerated  - PTA rantidine 75mg  QHS  - neurosurgery bowel regimen, ensure daily BM     Heme: No acute concerns  - H/H/plts 15.4/44.9/207 from 11/20/16  - Daily CBC  - assess for coagulopathy, maintain platelets above 100k, INR <1.5    ID: No acute concerns  - Tmax afebrile  - WBC 10.6 on 11/20/16   - aim for normothermia, Temp <38.3 celsius, normothermia protocol if febrile    Renal: No acute concerns  - Continue foley in immediate post op time periord  - Aim for normovolemia    Endocrine:  No acute concerns  - Glucose 109 on 11/20/16  - Blood glucose goal 100-180mg /dl     FEN: No acute concerns  - IVF: none  - Magnesium goal >2.0, i-Cal goal > 1.0, Potassium goal >4.0 mEq/L    Prophylaxis Review:   A)GI: PPI/H2Blocker   B) Lines:  A-line  C) Urinary Catheter:  Foley  D) Antibiotic Usage:  Yes - post op E) VTE:  SCDs  F) Isolation: None  G)Seizures: None  I) Restraints: Patient assessed for need for restraints.   Disposition/Family:  ICU    Primary service: NSGY    Consults: NCC    ___________________________________________________________________  SUBJECTIVE   Chief Complaint:  Left front bran lesion s/p crani    History of Present Illness: Todd Fischer is a 56 y.o. male with HTN, HLD, COPD and significant smoking history presents post operatively after resection of a large right frontal bran lesion. He initially presented from an OSH on 11/20/16 with complaints of headaches, left facial droop and increasing confusion over a month. Work up revealed a large right front mass.    He has a a significant smoking history and is a current smoker. 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     Currently he endorses some pain around his left ear. He denies nausea, vomiting, changes in vision, weakness, numbness,  tingling, chest pain, or SOB.  ???    Past Medical History:   Diagnosis Date   ??? Symptoms involving digestive system     reflux   ??? Unspecified disease of respiratory system     asthma/chronic bronchitis       Past Surgical History:   Procedure Laterality Date   ??? HX TONSILLECTOMY         No family history on file.    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 He is married and lives with his wife. He has 2 kids and 3 grand kids. He was a heavy drinker but stopped drinking about 1 year ago. He is a current smoker but plans to stop now. He is independent and able to care for himself.        Code Status: Full Code    Decision Maker: Wife     Immunizations (includes history and patient reported):   There is no immunization history on file for this patient.        Allergies:  Patient has no known allergies.    Prescriptions Prior to Admission   Medication Sig ??? aspirin EC 81 mg tablet Take 81 mg by mouth at bedtime daily. Take with food.   ??? diphenhydrAMINE (BENADRYL ALLERGY) 25 mg tablet Take 25 mg by mouth at bedtime as needed.   ??? guaiFENesin LA (MUCINEX) 600 mg tablet Take 600 mg by mouth at bedtime daily.   ??? krill/om-3/dha/epa/phospho/ast (MEGARED OMEGA-3 KRILL OIL PO) Take 1 capsule by mouth daily.   ??? lisinopril (PRINIVIL; ZESTRIL) 20 mg tablet Take 20 mg by mouth at bedtime daily.   ??? ranitidine (ZANTAC) 75 mg tablet Take 75 mg by mouth at bedtime daily.   ??? simvastatin (ZOCOR) 40 mg tablet Take 40 mg by mouth at bedtime daily.   ??? vitamins, multiple (MULTIPLE VITAMINS) tablet Take 1 tablet by mouth at bedtime daily.       Review of Systems:  A 14 point review of systems was negative except for: per HPI      OBJECTIVE                     Vital Signs: Last Filed                  Vital Signs: 24 Hour Range   BP: 167/86 (05/30 1238)  Temp: 36.6 ???C (97.8 ???F) (05/30 1156)  Pulse: 76 (05/30 1238)  Respirations: 15 PER MINUTE (05/30 1238)  SpO2: 97 % (05/30 1238)  O2 Delivery: None (Room Air) (05/30 1238)  BP: (127-171)/(77-108)   Temp:  [36.4 ???C (97.5 ???F)-36.6 ???C (97.8 ???F)]   Pulse:  [68-84]   Respirations:  [15 PER MINUTE-18 PER MINUTE]   SpO2:  [97 %-99 %]   O2 Delivery: None (Room Air)    Intensity Pain Scale 0-10 (Pain 1): (not recorded) Vitals:    11/20/16 1500   Weight: 81.6 kg (180 lb)           Lines:  A line  Drains: foley    Intake/Output Summary:  (Last 24 hours)    Intake/Output Summary (Last 24 hours) at 11/25/16 1314  Last data filed at 11/25/16 1125   Gross per 24 hour   Intake             2970 ml  Output              535 ml   Net             2435 ml       Stool Occurrence: 0    Physical Exam:    Blood pressure 167/86, pulse 76, temperature 36.6 ???C (97.8 ???F), height 182.9 cm (72), weight 81.6 kg (180 lb), SpO2 97 %.    Glasgow coma score:              E: 4 - Opens eyes on own          M: 6 - Follows simple motor commands V: 5 - Alert and oriented    Neuro:   Mental Status: alert and oriented x4   Cranial Nerves: Cranial nerves 2-12 INTACT unless otherwise listed below. , gag and cough not tested    Left facial droop      - Pupil exam: Size:         3mm               Reactivity: minimally reactive to light    - EOM: intact               Motor:         RUE: Strength: 5/5;                      RLE: Strength: 5/5;            LUE: Strength: 5/5; mildly weaker than RUE         LLE: Strength: 5/5;    Sensory: intact to light touch   Coordination: intact finger to nose    Lungs: clear to auscultation bilaterally                     Pulmonary:  Respiratory status:   Stable    Heart: S1, S2 normal  Abdomen: non tender, non distended  Extremities: contractures in right hand, mild reduced flexion and extension of fingers of left hand, no edema, atrumatic  Skin: warm to touch    Point of Care Testing:  (Last 24 hours):    Lab Review:  Pertinent labs reviewed    Radiology and Other Diagnostic Procedures Review:  Pertinent radiologic and diagnostic procedures reviewed.      Gracy Bruins, MD Date:  11/25/2016   [phone]

## 2016-11-26 MED ORDER — LISINOPRIL 20 MG PO TAB
20 mg | Freq: Every day | ORAL | 0 refills | Status: DC
Start: 2016-11-26 — End: 2016-11-27
  Administered 2016-11-27: 13:00:00 20 mg via ORAL

## 2016-11-26 MED ORDER — AMLODIPINE 10 MG PO TAB
10 mg | Freq: Every day | ORAL | 0 refills | Status: DC
Start: 2016-11-26 — End: 2016-11-27
  Administered 2016-11-27: 13:00:00 10 mg via ORAL

## 2016-11-26 MED ORDER — DEXAMETHASONE 4 MG PO TAB
4 mg | ORAL | 0 refills | Status: DC
Start: 2016-11-26 — End: 2016-11-27
  Administered 2016-11-27 (×2): 4 mg via ORAL

## 2016-11-26 MED ORDER — GADOBENATE DIMEGLUMINE 529 MG/ML (0.1MMOL/0.2ML) IV SOLN
17 mL | Freq: Once | INTRAVENOUS | 0 refills | Status: CP
Start: 2016-11-26 — End: ?

## 2016-11-26 MED ORDER — LEVETIRACETAM 500 MG PO TAB
500 mg | Freq: Two times a day (BID) | ORAL | 0 refills | Status: DC
Start: 2016-11-26 — End: 2016-11-27
  Administered 2016-11-27: 13:00:00 500 mg via ORAL

## 2016-11-26 MED ORDER — HYDROCHLOROTHIAZIDE 25 MG PO TAB
25 mg | Freq: Every day | ORAL | 0 refills | Status: DC
Start: 2016-11-26 — End: 2016-11-26

## 2016-11-26 MED ORDER — LISINOPRIL 20 MG PO TAB
40 mg | Freq: Every day | ORAL | 0 refills | Status: DC
Start: 2016-11-26 — End: 2016-11-26

## 2016-11-26 NOTE — Progress Notes
0400 Patient transported to MRI Scan via bed with this RN and 1 Tech. Patient calm/cooperative, oriented x4, VSS, on monitor. Will continue to monitor and assess.     4332 Patient transported from MRI Scan via bed back to room [phone] via bed with this RN and 1 Tech. Patient calm/cooperative, oriented x4, VSS, on monitor. Will continue to monitor and assess.

## 2016-11-26 NOTE — Progress Notes
Neurosurgery Progress Note      Admission Date: 11/20/2016 LOS: 6 days  ______________________________________________________________    S: Up in chair this AM on rounds, seen with Dr. Enrique Sack. Discussed MRI resultes. Plan of day discussed, questions answered.         O:                  Vital Signs: 24 Hour Range   BP: (143-169)/(67-89)   ABP: (121-196)/(45-88)   Temp:  [36.5 C (97.7 F)-37.3 C (99.1 F)]   Pulse:  [58-93]   Respirations:  [9 PER MINUTE-22 PER MINUTE]   SpO2:  [93 %-98 %]   O2 Delivery: None (Room Air)     Physical Exam:  Alert and Oriented x 4  Approrpriate conversation  Left facial droop, with drooling  Right strength 5/5 throughout  Left 4/5 throughout x grip 3+/5        A/P: 56 y.o. male   Active Problems:    Brain mass  Todd Fischer is a 56 y.o. male with HTN, HLD, COPD, 30 pack year smoking history presents as transfer with CT demonstrating right frontal mass and with left facial droop and reported confusion from family.    POD 1 right frontal crani with tumor resection- frz concerning for brain primary lesion  MRI brian reviewed post-op changes seen, tumor resected.  CT c/a/p - fluid filled bullous noted - Right upper lobe nodule - recommend PET or short term CT follow up 2-3 months  Right liver lesion - MRI - benign finding.  Onc following - appreciate recs, anticipate need for PET prior to discharge  Continue rest of current care for now  Progress to floor status.  PT/OT/SLP  Dispo -home tomorrow with family    Prophylaxis:   B) Lines:  No  C) Urinary Catheter:  No  D) Antibiotic Usage:  No  E) VTE:  Mechanical prophylaxis; Sequential compression device  F) Restraints: Patient assessed for need for restraints.     Please call [phone] with any questions.    Delfin Gant, APRN  Pager [phone]

## 2016-11-26 NOTE — Progress Notes
Notified Charis, APRN patient continues to have BP >160 after several doses of prn BP meds. BP meds reviewed with Lianne Bushy, APRN. Per Lianne Bushy, APRN administer Xanax for anxiety and notify if   BP >160 persists. RN will administer Xanax as ordered and reassess.

## 2016-11-26 NOTE — Progress Notes
OCCUPATIONAL THERAPY  NO TREATMENT NOTE     The patient was not seen due to: Patient resting, just back to bed. Will follow up tomorrow for evaluation.    Attempted to see patient 1 time(s)    Therapist: Boris Sharper, OT  Date: 11/26/2016

## 2016-11-26 NOTE — Progress Notes
Charis, APRN notified patient refused Xanax and BP >160 persists. Charis, APRN will place order for Cardene. RN will start Cardene as ordered and titrate to achieve BP <160.

## 2016-11-26 NOTE — Anesthesia Pain Rounding
Anesthesia Follow-Up Evaluation: Post-Procedure Day One    Name: Todd Fischer     MRN: [id number]     DOB: [birth year]     Age: 56 y.o.     Sex: male   __________________________________________________________________________     Procedure Date: 11/25/2016   Procedure: Procedure(s) with comments:  AWAKE RIGHT FRONTAL CRANIOTOMY FOR TUMOR RESECTION - CASE LENGTH 5 HOURS    Physical Assessment  Height: 182.9 cm (72)  Weight: 82.9 kg (182 lb 12.2 oz)    Vital Signs (Last Filed in 24 hours)  BP: 169/72 (05/30 1638)  Temp: 36.5 ???C (97.7 ???F) (05/31 0800)  Pulse: 81 (05/31 0900)  Respirations: 16 PER MINUTE (05/31 0900)  SpO2: 96 % (05/31 0900)  O2 Delivery: None (Room Air) (05/31 0900)  SpO2 Pulse: 81 (05/31 0900)    Patient History   Allergies  No Known Allergies     Medications  Scheduled Meds:  amLODIPine (NORVASC) tablet 10 mg 10 mg Oral QDAY   dexamethasone (DECADRON) injection 4 mg 4 mg Intravenous Q6H   docusate (COLACE) capsule 100 mg 100 mg Oral BID   levETIRAcetam (KEPPRA) tablet 500 mg 500 mg Oral BID   lisinopril (PRINIVIL; ZESTRIL) tablet 20 mg 20 mg Oral QDAY   milk of magnesia (CONC) oral suspension 10 mL 10 mL Oral QDAY   senna/docusate (SENOKOT-S) tablet 1 tablet 1 tablet Oral BID   Continuous Infusions:  ??? sodium chloride 0.9 %   infusion 1,000 mL (11/25/16 0655)     PRN and Respiratory Meds:acetaminophen Q4H PRN, hydrALAZINE Q6H PRN, labetalol (NORMODYNE; TRANDATE) injection Q15 MIN PRN, oxyCODONE Q3H PRN, prochlorperazine Q6H PRN      Diagnostic Tests  Hematology: Lab Results   Component Value Date    HGB 12.3 11/26/2016    HCT 35.3 11/26/2016    PLTCT 182 11/26/2016    WBC 14.0 11/26/2016    NEUT 91 11/26/2016    ANC 12.70 11/26/2016    ALC 0.60 11/26/2016    MONA 5 11/26/2016    AMC 0.70 11/26/2016    EOSA 0 11/26/2016    ABC 0.00 11/26/2016    MCV 90.5 11/26/2016    MCH 31.5 11/26/2016    MCHC 34.8 11/26/2016    MPV 8.5 11/26/2016    RDW 12.9 11/26/2016         General Chemistry: Lab Results Component Value Date    NA 133 11/26/2016    K 3.7 11/26/2016    CL 104 11/26/2016    CO2 21 11/26/2016    GAP 8 11/26/2016    BUN 25 11/26/2016    CR 1.01 11/26/2016    GLU 159 11/26/2016    CA 8.9 11/26/2016      Coagulation:   Lab Results   Component Value Date    PTT 31.6 11/20/2016    INR 1.1 11/20/2016         Follow-Up Assessment  Patient location during evaluation: ICU      Anesthetic Complications:   Anesthetic complications: The patient did not experience any anesthestic complications.      Pain:  Score: 0    Management:satisfactory to patient     Level of Consciousness: awake and alert   Hydration:acceptable     Airway Patency: patent   Respiratory Status: acceptable, nonlabored ventilation, spontaneous ventilation and unassisted     Cardiovascular Status:acceptable and hemodynamically stable   Regional/Neuroaxial:              Comments: Pt extremely thankful  for the in room providers during case. No problems or concerns noted.

## 2016-11-26 NOTE — Med Student Progress Note
Neuro Critical Care Progress Note          Todd Fischer  Admission Date: 11/20/2016  LOS: 6 days  Prior                      ASSESSMENT/PLAN     Patient Active Problem List    Diagnosis Date Noted   ??? 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      Hospital and ICU course:   5/30: craniotomy, admitted to neuro ICU  5/31: downgraded to floor status    Neuro:right mass, frontal lobe, s/p right craniotomy  -left facial droop on exam, mild cognitive changes (recall 1/3)  -continue dexamethasone 4mg  q6  -keppra 500mg  BID for ppx  -PT/OT, ST for cognitive  -q1hr neuro checks      Sedation/Pain Management:   -pain well controlled using PRN fentanyl and PRN oxycodone  -discontinue fentanyl  -continue PRN oxycodone 5-10 mg q3  -continue PRN tylenol 650mg  q4    Cardiac: HTN  -goal SBP <160; MAP <65  -cardene drip discontinued  -increase amlodipine to 10 mg qday  -continue PTA lisinopril 20mg   -PRN hydralazine  -PRN labetalol     Respiratory:  No acute concern, COPD, RUL nodule  -room air    GI: no acute concerns  -feeding: regular diet as tolerated  -bowel regimen for goal of daily BM    Heme: no acute concerns  -hgb stable 12.3; plts 182  - assess for coagulopathy, maintain platelets above 100k, INR <1.5    ID: no acute concerns  -afebrile  -ancef for post op ppx   -WBC 14, 1 day post op      Renal: no acute concerns  -BUN/Cr 25/1.01  -hyponatremic at 133, possible etiology perioperative fluids vs salt wasting    Intake/Output Summary (Last 24 hours) at 11/26/16 1145  Last data filed at 11/26/16 0700   Gross per 24 hour   Intake           1083.5 ml   Output             1960 ml   Net           -876.5 ml       Endocrine:  No acute concerns  - Blood glucose goal 100-180mg /dl  -glucose 161    FEN:   - IVF: none  - Magnesium goal >2.0, i-Cal goal > 1.0, Potassium goal >4.0 mEq/L -regular diet as tolerated      Prophylaxis Review:   A)GI: PPI/H2Blocker   B) Lines:  D/c arterial line  C) Urinary Catheter:  No  D) Antibiotic Usage:  Post op ppx ancef  E) VTE:  Mechanical prophylaxis; Sequential compression device  F) Isolation: none  G)Seizures: none  I) Restraints: Patient assessed for need for restraints.   Disposition/Family: Stable to transfer to floor.    Primary service: neurosurgery    Consults:  PT/OT, SLP    ___________________________________________________________________  SUBJECTIVE   Todd Fischer is a 56 y.o. male.  Overnight Events: Patient had elevated blood pressure overnight. Did not receive his home dose of lisinopril yesterday.  Patient reports dull right sided headache although pretty well controlled. Has not had a bowel movement since yesterday before surgery. No problems urinating.     OBJECTIVE  Vital Signs: Last Filed                  Vital Signs: 24 Hour Range   BP: 143/89 (05/31 1000)  ABP: 165/68 (05/31 1000)  Temp: 36.5 ???C (97.7 ???F) (05/31 0800)  Pulse: 73 (05/31 1000)  Respirations: 15 PER MINUTE (05/31 1000)  SpO2: 93 % (05/31 1000)  O2 Delivery: None (Room Air) (05/31 1000)  Weight: 82.9 kg (182 lb 12.2 oz) (05/31 0500)  BP: (143-169)/(67-89)   ABP: (121-196)/(45-88)   Temp:  [36.5 ???C (97.7 ???F)-37.1 ???C (98.8 ???F)]   Pulse:  [56-93]   Respirations:  [9 PER MINUTE-22 PER MINUTE]   SpO2:  [93 %-98 %]   O2 Delivery: None (Room Air)    Intensity Pain Scale 0-10 (Pain 1): (not recorded) Vitals:    11/20/16 1500 11/26/16 0500   Weight: 81.6 kg (180 lb) 82.9 kg (182 lb 12.2 oz)         Artificial airway:  None              Ventilator/ Respiratory Therapy:  No   Vent weaning trial:  Not applicable    Lines:  None  Drains: None    Critical Care Vitals:      ICP Monitoring:     Hemodynamics/Oxycalcs:       Intake/Output Summary:  (Last 24 hours)    Intake/Output Summary (Last 24 hours) at 11/26/16 1102  Last data filed at 11/26/16 0700   Gross per 24 hour Intake           1483.5 ml   Output             2010 ml   Net           -526.5 ml       Stool Occurrence: 0        Physical Exam:    Blood pressure 143/89, pulse 73, temperature 36.5 ???C (97.7 ???F), height 182.9 cm (72), weight 82.9 kg (182 lb 12.2 oz), SpO2 93 %.    Glasgow coma score:              E: 4 - Opens eyes on own         M: 6 - Follows simple motor commands             V: 5 - Alert and oriented)    Neuro:   Mental Status: alert and oriented x4, mild confusion, word recall 1/3   Cranial Nerves: Cranial nerves 2-12 INTACT unless otherwise listed below.  -left facial droop      - Pupil exam: Size:         4               Reactivity: reactive    - EOM: intact                  Motor:         RUE: Strength: 5/5;                    RLE: Strength: 5/5;             LUE: Strength: 5/5;          LLE: Strength: 5/5;    Sensory: intact to light touch      Lungs: clear to auscultation bilaterally                     Pulmonary:  stable   Heart: regular rate and rhythm, S1, S2 normal  Abdomen: soft, non-tender. Bowel sounds normal. No masses,  no organomegaly  Extremities: extremities normal, atraumatic, no cyanosis or edema  Skin: Skin color, texture, turgor normal. No rashes or lesions    Point of Care Testing:  (Last 24 hours)  Glucose: (!) 159 (11/26/16 0335)    Lab Review:    24-hour labs:    Results for orders placed or performed during the hospital encounter of 11/20/16 (from the past 24 hour(s))   CBC AND DIFF    Collection Time: 11/26/16  3:35 AM   Result Value Ref Range    White Blood Cells 14.0 (H) 4.5 - 11.0 K/UL    RBC 3.90 (L) 4.4 - 5.5 M/UL    Hemoglobin 12.3 (L) 13.5 - 16.5 GM/DL    Hematocrit 16.1 (L) 40 - 50 %    MCV 90.5 80 - 100 FL    MCH 31.5 26 - 34 PG    MCHC 34.8 32.0 - 36.0 G/DL    RDW 09.6 11 - 15 %    Platelet Count 182 150 - 400 K/UL    MPV 8.5 7 - 11 FL    Neutrophils 91 (H) 41 - 77 %    Lymphocytes 4 (L) 24 - 44 %    Monocytes 5 4 - 12 %    Eosinophils 0 0 - 5 %    Basophils 0 0 - 2 % Absolute Neutrophil Count 12.70 (H) 1.8 - 7.0 K/UL    Absolute Lymph Count 0.60 (L) 1.0 - 4.8 K/UL    Absolute Monocyte Count 0.70 0 - 0.80 K/UL    Absolute Eosinophil Count 0.00 0 - 0.45 K/UL    Absolute Basophil Count 0.00 0 - 0.20 K/UL   BASIC METABOLIC PANEL    Collection Time: 11/26/16  3:35 AM   Result Value Ref Range    Sodium 133 (L) 137 - 147 MMOL/L    Potassium 3.7 3.5 - 5.1 MMOL/L    Chloride 104 98 - 110 MMOL/L    CO2 21 21 - 30 MMOL/L    Anion Gap 8 3 - 12    Glucose 159 (H) 70 - 100 MG/DL    Blood Urea Nitrogen 25 7 - 25 MG/DL    Creatinine 0.45 0.4 - 1.24 MG/DL    Calcium 8.9 8.5 - 40.9 MG/DL    eGFR Non African American >60 >60 mL/min    eGFR African American >60 >60 mL/min       Radiology and Other Diagnostic Procedures Review:  Pertinent radiologic and diagnostic procedures reviewed.      Bethena Roys, MS Date:  11/26/2016

## 2016-11-26 NOTE — Case Management (ED)
Case Management Progress Note    NAME:Sharif Lesinski                          MRN: [id number]              DOB:03-31-1961          AGE: 56 y.o.  ADMISSION DATE: 11/20/2016             DAYS ADMITTED: LOS: 6 days      Todays Date: 11/26/2016    Plan  Ongoing DC planning - Documentation of hospitalization    Interventions  ? Support      ? Info or Referral      ? Discharge Planning      ? Medication Needs      ? Financial   NCM was notified by ICU RN that patient's brother required documentation of patient's hospitalization for his employer - NCM provided this at 1250 - he appreciates.  ? Legal      ? Other        Disposition  ? Expected Discharge Date    Expected Discharge Date: 11/27/16  ? Transportation   Does the patient need discharge transport arranged?: No  Transportation Name, Phone and Availability #1: Pt's wife will drive him home  Does the patient use Medicaid Transportation?: No  ? Next Level of Care (Acute Psych discharges only)      ? Discharge Disposition                                          Durable Medical Equipment     No service has been selected for the patient.      [place] [facility]     No service has been selected for the patient.      [place]     No service has been selected for the patient.      [facility]     No service has been selected for the patient.          Clyda Hurdle, BSN, Production designer, theatre/television/film  [phone]

## 2016-11-26 NOTE — Progress Notes
SPEECH-LANGUAGE PATHOLOGY  CLINICAL SWALLOW ASSESSMENT     EVALUATION SUMMARY   Summary: Clinical swallow evaluation completed this date. Mild to moderate oropharyngeal dysphagia appreciated 2* weakness and sensation changes from recent R frontal tumor resection. Pt additionally continues to demonstrate memory and attention deficits from initial evaluation completed 5/29 throughout session this date and wife reports ongoing cognitive changes from baseline-ongoing speech services still recommended. Please see details below.     RECOMMENDATIONS:   1:Purees with NECTAR thick liquids.  Upright position and slow rate. Complete lingual sweep as needed to clear L buccal cavity. PO medications either whole or crushed in purees or with nectar thick liquids.   2:At this time would recommend ongoing speech therapy services to address cognitive-communicative changes. Intermittent assist w/ finance & medication management and meal preparation upon discharge secondary to patient's impaired attention and memory.      Oral Stage Summary*: Pt seen with thin and nectar thick liquids, purees and mechanical soft solids. Withdraw slowed with loss from spoon to L labial surface/chin/cheek. Control was reduced 2* reduced lingual motion and strength especially on L. Cueing required to complete lingual/finger sweep to L to clear oral cavity. Mastication of mechanical soft solids was prolonged with most of solid remaining in mouth despite therapist cues and attempts to clear.      Pharyngeal Stage Summary*: Swallow initiation delayed with suspected some reduced elevation. Anticipate delayed airway closure/poor oral control resulting in coughing with thin liquids especially given larger drinks. No overt s/s of aspiration noted with nectar thick liquids.    Swallow Recommendations*   PO: Pureed solids with nectar thick liquids.   Plan: Will continue to follow 3-5x per week.   Prognosis: Good NOMS Dysphagia Rating: Mild to moderate oropharyngeal dysphagia  Results Reported to Physician: Yes    Objective*  Todd Fischer???is a 56 y.o.???male???with HTN, HLD, COPD, 30 pack year smoking history presents as transfer with CT demonstrating right frontal mass and with left facial droop and reported confusion from family s/p R frontal crani with tumor resection.    MRI head  IMPRESSION  1. ???Interval resection of large right frontal lobe mass with small amount   of curvilinear enhancement along the posterior margin as well as punctate   area of enhancement along the superomedial margin of the operative cavity,   which may be postsurgical in nature or represent residual tumor.  2. ???Expected postoperative blood products, fluid, edema, and   pneumocephalus within and adjacent to the operative cavity as well as   within the extra-axial space.  3. ???Abnormal FLAIR hyperintensity within the posterior left thalamus, left   cerebral cortex, and deep left periventricular white matter. Differential   diagnoses include synchronous areas of nonenhancing tumor, sequela of   seizures, or subacute ischemic foci where diffusion has normalized.   Attention on follow-up is suggested.    Handedness: Right   Hearing: WFL   Psychosocial Status: Willing and Cooperative to Participate;     Subjective*   Pain: Patient has no complaint of pain     Pain Level Current*: No pain   Trach Presence: No   Feeding Tube Present During Eval: None     Nutrition*   Nutrition Prior To Hospitalization: Regular;Thin Liquids   Current Form Of Nutrition: Regular with thin liquids    ORAL MECH EXAM   Oral Mech [facility]*: Face asymmetrical at rest and upon labial retraction-reduced movement on L. Lingual ROM, coordination and strength reduced especially to L. Jaw strength WFL. Pt  with natural dentition with missing teeth; some appeared in poor condition. Vocal quality slightly wet at times.      Education*   Persons Educated: Patient/Family Barriers To Learning: cognitive changes  Interventions: Repetition of Instructions   Teaching Methods: Verbal   Topics: Dysphagia   Patient Response: Verbalized Understanding     Goal: Pt will tolerate the least restrictive diet (pureed solids with nectar thick liquids) with no overt s/s of aspiration.  Goal: Pt will participate in PO trials for possible diet upgrade with minimal cues.   Goal: Pt will participate in an ongoing cognitive evaluation with mild to moderate cues.     Therapist:Jessica Seidman - M.S. CCC-L/SLP   [id number]  [id number] [phone]  Date:11/26/2016

## 2016-11-26 NOTE — Progress Notes
Notified Charis, APRN with neuro icu team all PRN blood pressure meds utilized at this time and pt continues to have BP>160. Labetalol order currently for 10mg  every 6 hours. Charis, APRN will adjust Labetalol order and RN will administer for BP >160.

## 2016-11-26 NOTE — Progress Notes
Neuro Critical Care Progress Note          Todd Fischer  Admission Date: 11/20/2016  LOS: 6 days  Prior                      ASSESSMENT/PLAN     Patient Active Problem List    Diagnosis Date Noted   ??? 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 male with HTN, HLD, COPD and significant smoking history presents post operatively after resection of a large left front bran lesion.  ???  Hospital and ICU course:   5/30: admitted to ICU s/p right sided awake crani, doing well  5/31: doing well today, mild confusion  ???  Neuro: Right frontal mass, s/p awake right crani  - Remains stable, mild confusion, baseline left sided facial droop  - Continue Dex 4q6 per primary team  - Continue to work with PT/OT  - Continue to work with SLP cog  - Q1h checks, keppra 500mg  BID ppx  ???  Sedation/Pain Management: No  - No significant complaints this AM  - d/c'd fentanyl  - PRN tylenol 650mg  Q4h and oxy 5-10mg  q3h  - Assess for delirium daily  ???  Cardiac: HTN, HLD  - SBP goal <140  - MAP goal > 65  - Continue home lisinopril 20mg  QHS   - Will increase dose if BP remains elevated  - Amlodipine increased to 10mg   - Holding simvastatin 40mg  QHS and ASA 81mg  QD  ???  Respiratory:  COPD, RUL nodule  - No acute concerns  - Breathing room air without distress  - Continue to monitor  ???  GI: No acute concerns  - Feeding: regular diet  - PTA rantidine 75mg  QHS on MAR, but patient does not complain of any GERD hx or GI symptoms  - neurosurgery bowel regimen, ensure daily BM     Heme: No acute concerns  - H/H/plts 12.3/35.3/182  - Daily CBC  - assess for coagulopathy, maintain platelets above 100k, INR <1.5  ???  ID: No acute concerns  - Tmax afebrile  - WBC 14   - aim for normothermia, Temp <38.3 celsius, normothermia protocol if febrile  ???  Renal: No acute concerns  - d/c folye  - Aim for normovolemia  ???  Endocrine:  No acute concerns  - Glucose 159  - Blood glucose goal 100-180mg /dl   ???  FEN: No acute concerns  - IVF: none - Magnesium goal >2.0, i-Cal goal > 1.0, Potassium goal >4.0 mEq/L  Prophylaxis Review:   A)GI: None  B) Lines:  D/c a-line  C) Urinary Catheter:  None  D) Antibiotic Usage:  Post op ancef  E) VTE:  SCDs  F) Isolation: None  G)Seizures: None  I) Restraints: Patient assessed for need for restraints.   Disposition/Family: Unchanged.    Primary service: NSGY    Consults:  SLP    ___________________________________________________________________  SUBJECTIVE   Todd Fischer is a 56 y.o. male.  Overnight Events: No new events noted.  Patient had elevated BPs overnight for which cardene was started. Patient did no receive yesterdays dose of lisinopril. Exam has remained stable without significant changes. Patient states he has some mild pain and swelling on the right side of his face.    Endores some mild weakness in LUE and fine motor movements of his left hand. He denies any nausea, vomiting, chest pain, SOB, abdominal pain.  OBJECTIVE                     Vital Signs: Last Filed                  Vital Signs: 24 Hour Range   BP: 169/72 (05/30 1638)  ABP: 178/67 (05/31 0900)  Temp: 36.5 ???C (97.7 ???F) (05/31 0800)  Pulse: 81 (05/31 0900)  Respirations: 16 PER MINUTE (05/31 0900)  SpO2: 96 % (05/31 0900)  O2 Delivery: None (Room Air) (05/31 0900)  Weight: 82.9 kg (182 lb 12.2 oz) (05/31 0500)  BP: (150-169)/(67-86)   ABP: (121-196)/(45-88)   Temp:  [36.5 ???C (97.7 ???F)-37.1 ???C (98.8 ???F)]   Pulse:  [56-93]   Respirations:  [9 PER MINUTE-22 PER MINUTE]   SpO2:  [93 %-98 %]   O2 Delivery: None (Room Air)    Intensity Pain Scale 0-10 (Pain 1): (not recorded) Vitals:    11/20/16 1500 11/26/16 0500   Weight: 81.6 kg (180 lb) 82.9 kg (182 lb 12.2 oz)         Lines:  Arterial Line  Drains: None    Intake/Output Summary:  (Last 24 hours)    Intake/Output Summary (Last 24 hours) at 11/26/16 1049  Last data filed at 11/26/16 0700   Gross per 24 hour   Intake           1483.5 ml   Output             2110 ml   Net           -626.5 ml Stool Occurrence: 0    Physical Exam:    Blood pressure 169/72, pulse 81, temperature 36.5 ???C (97.7 ???F), height 182.9 cm (72), weight 82.9 kg (182 lb 12.2 oz), SpO2 96 %.    Glasgow coma score:              E: 4 - Opens eyes on own         M: 6 - Follows simple motor commands             V: 5 - Alert and oriented    Neuro:   Mental Status: awake, alert & oriented x4, mild memory deficits, mid confusion    Cranial Nerves: Cranial nerves 2-12 INTACT unless otherwise listed below. , gag & cough not tested    Left facial droop      - Pupil exam: Size:         4mm               Reactivity: minimally reactive to light    - EOM: Intact                Motor:         RUE: Strength: 5/5;                     RLE: Strength: 5/5;             LUE: Strength: 5/5; mildly weaker than RUE         LLE: Strength: 5/5;    Sensory: intact to light touch    Lungs: clear to auscultation bilaterally                     Pulmonary:  Respiratory status:   Stable    Heart: S1, S2 normal  Abdomen: soft, non tender, non distedned  Extremities: extremities normal, atraumatic, no cyanosis or  edema  Skin: Skin color, texture, turgor normal. No rashes or lesions    Point of Care Testing:  (Last 24 hours)  Glucose: (!) 159 (11/26/16 0335)    Lab Review:  Pertinent labs reviewed    Radiology and Other Diagnostic Procedures Review:  Pertinent radiologic and diagnostic procedures reviewed.      Gracy Bruins, MD Date:  11/26/2016   [phone]

## 2016-11-26 NOTE — Progress Notes
PHYSICAL THERAPY  ASSESSMENT     MOBILITY:  Mobility  Progressive Mobility Level: Walk laps  Distance Walked (feet): 400 ft  Level of Assistance: Assist X1  Assistive Device: None  Time Tolerated: 11-30 minutes  Activity Limited By: No limitations    SUBJECTIVE:  Subjective  Significant hospital events: 56 y.o. male with HTN, HLD, COPD and significant smoking history presents post operatively after resection of a large left front bran lesion 5/30  Mental / Cognitive Status: Oriented;Alert;Cooperative  Persons Present: Family (at end of visit)  Pain: Patient has no complaint of pain  Ambulation Assist: Independent Mobility in [employer] without Device  Patient Owned Equipment: None  Home Situation: Lives with Family  Type of Home: House  Entry Stairs: 3-5 Stairs  In-Home Stairs: 3-5  Stairs  Comments: Denies falls prior to admission.     ROM:  ROM  ROM Position Assessed: Seated  ROM Method: Active  LE ROM: Bilateral;WFL    STRENGTH:  Strength  Strength Position Assessed: Seated  Overall Strength: No Focal Deficits Noted    POSTURE/NEURO:  Posture / Neurological  Head Control: Independent  Posture: No Postural Deviations  Overall Tone: Normal    BED MOBILITY/TRANSFERS:  Bed Mobility/Transfers  Transfer Type: Sit to Stand  Transfer: Assistance Level: To/From;Bed Side Chair;Standby Assist  Transfer: Assistive Device: None  Transfers: Type Of Assistance: Verbal Cues;For Balance  End Of Activity Status: Up in Chair;Nursing Notified;Instructed Patient to Request Assist with Mobility;Instructed Patient to Use Call Light (TABs alarm in seat)    BALANCE:  Balance  4 Item Dynamic Gait Index: Assessed  Gait Level Surface: 2 - Mild Impairment: Walks 20', uses assistive devices, slower speed, mild gait deviations  Change in Gait Speed: 2 - Mild Impairment: Is able to change speed but demonstrates mild gait deviations, or not gait deviations but unable to achieve a significant change in velocity, or uses an assistive device. Gait with Horizontal Head Turns: 2 - Mild Impairment: Performs head turns smoothly with slight change in gait velocity, ie., minor disruption to smooth gait path or uses walking aid.  Gait with Vertical Head Turns: 2 - Mild Impairment: Performs head turns smoothly with slight change in gait velocity, ie., minor disruption to smooth gait path or uses walking aid.  4 Dynamic Gait Index Total : 8 out of 12  4 Dynamic Gait Index Fall Risk: Less than 10 - Increased Risk for Falls    GAIT:  Gait  Gait Distance: 400 feet  Gait: Assistance Level: Minimal Assist  Gait: Assistive Device: None  Gait: Descriptors: Pace: Slow;Pathway deviations;No balance loss  Stairs: Number Climbed: 3  Stairs: Descriptors: Reciprocal  Stairs: Assistance Level: Minimal Assist  Stairs: Assistive Device: One Rail  Activity Limited By: Patient Choice    EDUCATION:  Education  Persons Educated: Patient  Patient Barriers To Learning: None Noted  Interventions: Repetition of Instructions;Family Education  Teaching Methods: Verbal Instruction  Patient Response: Verbalized Understanding  Topics: Plan/Goals of PT Interventions;Mobility Progression;Safety Awareness;Up with Assist Only;Importance of Increasing Activity;Precautions;Recommend Continued Therapy    ASSESSMENT/PROGRESS:  Assessment/Progress  Impaired Mobility Due To: Impaired Balance;Decreased Activity Tolerance  Assessment/Progress: Should Improve w/ Continued PT  AM-PAC 6 Clicks Basic Mobility Inpatient  Turning from your back to your side while in a flat bed without using bed rails: A Little  Moving from lying on your back to sitting on the side of a flatbed without using bedrails : A Little  Moving to and from a bed to a  chair (including a wheelchair): A Little  Standing up from a chair using your arms (e.g. wheelchair, or bedside chair): A Little  To walk in hospital room: A Little  Climbing 3-5 steps with a railing: A Little  Raw Score: 18  Standardized (T-scale) Score: 41.05 Basic Mobility CMS 0-100%: 40.47  CMS G Code Modifier for Basic Mobility: CK    GOALS:  Goals  Goal Formulation: With Patient  Time For Goal Achievement: 7 days  Pt Will Ambulate: Greater than 200 Feet, w/ Stand By Assist  Pt Will Go Up / Down Stairs: 3-5 Stairs, w/ Stand By Assist  Other Goal: Will score low fall risk on balance test    PLAN:  Plan   Treatment Interventions: Mobility Training;Strengthening;Balance Activities  Plan Frequency: 5 Days per Week  PT Plan for Next Visit: plan on 1 more visit to increase independence with stairs; gait and re-test balance    RECOMMENDATIONS:  PT Discharge Recommendations  PT Discharge Recommendations: Home with Assistance  Equipment Recommendations: None    G-Codes: Mobility  [id number] Current Status:  40-59% Impairment  [id number] Goal Status:  1-19% Impairment    Based on above evaluation and clinical judgment.    Therapist: Jeanette Caprice, PT  Date: 11/26/2016

## 2016-11-27 ENCOUNTER — Encounter: Admit: 2016-11-27 | Discharge: 2016-11-27

## 2016-11-27 DIAGNOSIS — F17210 Nicotine dependence, cigarettes, uncomplicated: ICD-10-CM

## 2016-11-27 DIAGNOSIS — G936 Cerebral edema: ICD-10-CM

## 2016-11-27 DIAGNOSIS — R590 Localized enlarged lymph nodes: ICD-10-CM

## 2016-11-27 DIAGNOSIS — R911 Solitary pulmonary nodule: ICD-10-CM

## 2016-11-27 DIAGNOSIS — E785 Hyperlipidemia, unspecified: ICD-10-CM

## 2016-11-27 DIAGNOSIS — R29810 Facial weakness: ICD-10-CM

## 2016-11-27 DIAGNOSIS — J449 Chronic obstructive pulmonary disease, unspecified: ICD-10-CM

## 2016-11-27 DIAGNOSIS — C711 Malignant neoplasm of frontal lobe: Principal | ICD-10-CM

## 2016-11-27 DIAGNOSIS — I10 Essential (primary) hypertension: ICD-10-CM

## 2016-11-27 DIAGNOSIS — G935 Compression of brain: ICD-10-CM

## 2016-11-27 DIAGNOSIS — K769 Liver disease, unspecified: ICD-10-CM

## 2016-11-27 LAB — CBC AND DIFF: Lab: 11 K/UL — ABNORMAL HIGH (ref 4.5–11.0)

## 2016-11-27 LAB — BASIC METABOLIC PANEL
Lab: 134 MMOL/L — ABNORMAL LOW (ref >60)
Lab: 4 MMOL/L — ABNORMAL LOW (ref >60)

## 2016-11-27 MED ORDER — ASPIRIN 81 MG PO TBEC
81 mg | ORAL_TABLET | Freq: Every evening | ORAL | 3 refills | Status: AC
Start: 2016-11-27 — End: ?

## 2016-11-27 MED ORDER — ACETAMINOPHEN 325 MG PO TAB
650 mg | ORAL | 0 refills | Status: AC | PRN
Start: 2016-11-27 — End: ?

## 2016-11-27 MED ORDER — DEXAMETHASONE 2 MG PO TAB
ORAL_TABLET | Freq: Three times a day (TID) | INTRAMUSCULAR | 0 refills | 14.00000 days | Status: AC
Start: 2016-11-27 — End: 2016-12-09

## 2016-11-27 MED ORDER — OXYCODONE 5 MG PO TAB
5-10 mg | ORAL_TABLET | ORAL | 0 refills | 6.00000 days | Status: AC | PRN
Start: 2016-11-27 — End: 2016-12-09

## 2016-11-27 MED ORDER — LISINOPRIL 40 MG PO TAB
40 mg | ORAL_TABLET | Freq: Every day | ORAL | 1 refills | Status: AC
Start: 2016-11-27 — End: ?

## 2016-11-27 MED ORDER — SENNOSIDES-DOCUSATE SODIUM 8.6-50 MG PO TAB
1 | ORAL | 0 refills | Status: AC | PRN
Start: 2016-11-27 — End: ?

## 2016-11-27 MED ORDER — LEVETIRACETAM 500 MG PO TAB
500 mg | ORAL_TABLET | Freq: Two times a day (BID) | ORAL | 0 refills | 90.00000 days | Status: AC
Start: 2016-11-27 — End: 2016-12-09

## 2016-11-27 MED ORDER — HEPARIN, PORCINE (PF) 5,000 UNIT/0.5 ML IJ SYRG
5000 [IU] | SUBCUTANEOUS | 0 refills | Status: DC
Start: 2016-11-27 — End: 2016-11-27

## 2016-11-27 MED ORDER — HYDRALAZINE 20 MG/ML IJ SOLN
10 mg | Freq: Once | INTRAVENOUS | 0 refills | Status: CP
Start: 2016-11-27 — End: ?
  Administered 2016-11-27: 09:00:00 10 mg via INTRAVENOUS

## 2016-11-27 NOTE — Progress Notes
SPEECH-LANGUAGE PATHOLOGY  DAILY TREATMENT NOTE      Patient seen 1x this date.  Documentation reflects all daily treatment sessions.    SUMMARY OF THERAPY SESSION: pt seen for dysphagia therapy session. Pt tolerating pureed diet with nectar thick liquids well. Preparing for dc this date. Decreased left facial droop and decreased anterior salivary escape reported. Pt tolerated thin liquids via cup/straw with no overt s/s aspiration in over 90% of trials. One delayed throat clear with consecutive swallows thin liquid water via straw. Pt tolerating purees well. Continues to pocket mechanical soft solids with decreased awareness of pocketing. Reinforced swallow precautions with pt/spouse.     RECOMMENDATIONS:   1:Purees with thin liquids.  Upright position and slow rate. Complete lingual sweep as needed to clear L buccal cavity. PO medications either whole or crushed in purees. Check for pocketing.   2:At this time would recommend ongoing speech therapy services to address cognitive-communicative changes. Intermittent assist w/ finance &medication management and meal preparation upon discharge secondary to patient's impaired attention and memory.     Goal : Pt will tolerate the least restrictive diet (pureed solids with nectar thick liquids) with no overt s/s of aspiration.  Met  Comment: see above  Discharge this goal as met    Goal :  Pt will participate in PO trials for possible diet upgrade with minimal cues.   Met  Comment: see above  Continue to address this goal    Goal : Pt will participate in an ongoing cognitive evaluation with mild to moderate cues.   Not addressed  Comment: not directly addressed this date  Continue to address this goal    PLAN / RECOMMENDATIONS:  Continue treatment 3x/week and Patient would benefit from further speech therapy post acute hospitalization.    Therapist: Serena Croissant, MS,CCC-SLP [phone]  Date: 11/27/2016

## 2016-11-27 NOTE — Case Management (ED)
Case Management Progress Note    NAME:Todd Fischer                          MRN: [id number]              DOB:Jun 03, 1961          AGE: 56 y.o.  ADMISSION DATE: 11/20/2016             DAYS ADMITTED: LOS: 7 days      Todays Date: 11/27/2016    Plan  DC to home - Outpatient therapy recommendations    Interventions  ? Support      ? Info or Referral      ? Discharge Planning   NCM notes therapy recommendations for outpatient ST/OT/PT - NCM tasked CMA Pool D to deliver lists of in-network providers in patient's area to his room.  ? Medication Needs      ? Financial      ? Legal      ? Other        Disposition  ? Expected Discharge Date    Expected Discharge Date: 11/27/16  ? Transportation   Does the patient need discharge transport arranged?: No  Transportation Name, Phone and Availability #1: Pt's wife will drive him home  Does the patient use Medicaid Transportation?: No  ? Next Level of Care (Acute Psych discharges only)      ? Discharge Disposition                                          Durable Medical Equipment     No service has been selected for the patient.      [facility]     No service has been selected for the patient.      [place]     No service has been selected for the patient.      [facility]     No service has been selected for the patient.          Clyda Hurdle, BSN, Production designer, theatre/television/film  [phone]

## 2016-11-27 NOTE — Case Management (ED)
Request for Case Management Resources    Delivered list of outpatient PT/OT/SLP providers in network with pt's insurance to pt's bedside per request of Allegra Dalton RNCM.    [place]rape, [place]

## 2016-11-27 NOTE — Progress Notes
PHYSICAL THERAPY  PROGRESS & DISCHARGE NOTE    MOBILITY:  Mobility  Progressive Mobility Level: Walk laps  Distance Walked (feet): 500 ft  Level of Assistance: Stand by assistance  Assistive Device: None  Time Tolerated: 11-30 minutes  Activity Limited By: No limitations    SUBJECTIVE:  Subjective  Significant hospital events: 56 y.o. male with HTN, HLD, COPD and significant smoking history presents post operatively after resection of a large left front bran lesion 5/30  Mental / Cognitive Status: Oriented;Alert;Cooperative  Persons Present: Spouse  Pain: Patient has no complaint of pain    BED MOBILITY/TRANSFERS:  Bed Mobility/Transfers  Transfer Type: Sit to Stand  Transfer: Assistance Level: To/From;Bed Side Chair;Standby Assist  Transfer: Assistive Device: None  Transfers: Type Of Assistance: Verbal Cues;For Balance  End Of Activity Status: Up in Chair;Nursing Notified;Instructed Patient to Request Assist with Mobility;Instructed Patient to Use Call Light (TABs alarm in seat)  Comments: Observed patient ambulating independently on unit this morning.    BALANCE:  Balance  Functional Gait Assessment: Assessed  Gait level surface: Mild Impairment  Change in gait speed: Mild Impairment  Gait with horizontal head turns: Mild Impairment  Gait with vertical head turns: Mild Impairment  Gait and pivot turn: Mild Impairment  Step over obstacle: Mild Impairment  Gait with narrow base of support: Mild Impairment  Gait with eyes closed: Mild Impairment  Ambulating backwards: Mild Impairment  Steps: Mild Impairment  Functional Gait Assessment Score:: 20  For adults less than 2 years of age, a score less than or equal to  22/30, were found to be at a higher risk of falling.      GAIT:  Gait  Gait Distance: 500 feet  Gait: Assistance Level: Standby Assist  Gait: Assistive Device: None  Gait: Descriptors: Pace: Slow;Pathway deviations;No balance loss  Stairs: Number Climbed: 3  Stairs: Descriptors: Reciprocal Stairs: Assistance Level: Standby Assist  Stairs: Assistive Device: One Data processing manager Limited By: Patient Choice    EDUCATION:  Education  Persons Educated: Patient  Patient Barriers To Learning: None Noted  Interventions: Repetition of Instructions;Family Education  Teaching Methods: Verbal Instruction  Patient Response: Verbalized Understanding  Topics: Plan/Goals of PT Interventions;Mobility Progression;Safety Awareness;Up with Assist Only;Importance of Increasing Activity;Precautions;Recommend Continued Therapy    ASSESSMENT/PROGRESS:  Assessment/Progress  Impaired Mobility Due To: Impaired Balance;Decreased Activity Tolerance  Assessment/Progress: Should Improve w/ Continued PT  AM-PAC 6 Clicks Basic Mobility Inpatient  Turning from your back to your side while in a flat bed without using bed rails: None  Moving from lying on your back to sitting on the side of a flatbed without using bedrails : None  Moving to and from a bed to a chair (including a wheelchair): None  Standing up from a chair using your arms (e.g. wheelchair, or bedside chair): None  To walk in hospital room: A Little  Climbing 3-5 steps with a railing: A Little  Raw Score: 22  Standardized (T-scale) Score: 47.4  Basic Mobility CMS 0-100%: 25.02  CMS G Code Modifier for Basic Mobility: CJ    GOALS:  Goals  Goal Formulation: With Patient  Time For Goal Achievement: 7 days  Pt Will Ambulate: Greater than 200 Feet, w/ Stand By Assist, Met  Pt Will Go Up / Down Stairs: 3-5 Stairs, w/ Stand By Assist, Met  Other Goal: Will score low fall risk on balance test (no met)    PLAN:  Plan   Treatment Interventions: Mobility Training;Strengthening;Balance Activities  Plan Frequency: No  Further Treatment    RECOMMENDATIONS:  PT Discharge Recommendations  PT Discharge Recommendations: Home with Assistance and Outpatient Physical Therapy for high level balance improvement  Equipment Recommendations: None    Therapist: Jeanette Caprice, PT  Date: 11/27/2016

## 2016-11-27 NOTE — Progress Notes
Neurosurgery Progress Note      Admission Date: 11/20/2016 LOS: 7 days  ______________________________________________________________    S: Doing well, wife at bedside, discussed discharge planning, both in agreement with plan for home-possibly later today. Less drooling this AM than yesterday, tolerating modified diet well. Seen with Dr. Enrique Sack.          O:                  Vital Signs: 24 Hour Range   BP: (125-184)/(69-98)   Temp:  [36.7 C (98 F)-37.3 C (99.1 F)]   Pulse:  [73-95]   Respirations:  [16 PER MINUTE-20 PER MINUTE]   SpO2:  [96 %-100 %]   O2 Delivery: None (Room Air)     Physical Exam:  Alert and Oriented x 4  Approrpriate conversation  Left facial droop, with drooling  Right strength 5/5 throughout  Left 4/5 throughout x grip 3+/5  Incision C/D/I without redness or drainage.        A/P: 56 y.o. male   Active Problems:    Brain mass  Todd Fischer is a 56 y.o. male with HTN, HLD, COPD, 30 pack year smoking history presents as transfer with CT demonstrating right frontal mass and with left facial droop and reported confusion from family.    POD 2 right frontal crani with tumor resection- frz concerning for brain primary lesion  MRI brian reviewed post-op changes seen, tumor resected.  CT c/a/p - fluid filled bullous noted - Right upper lobe nodule - recommend PET or short term CT follow up 2-3 months  Right liver lesion - MRI - benign finding.  Onc following - to follow-up with the patient today.  Continue rest of current care for now  Floor status  PT/OT/SLP  Dispo -home later today, awaiting SLP to re-eval diet before d/c, outpt PT/OT/SLP    Prophylaxis:   B) Lines:  No  C) Urinary Catheter:  No  D) Antibiotic Usage:  No  E) VTE:  Mechanical prophylaxis; Sequential compression device  F) Restraints: Patient assessed for need for restraints.     Please call [phone] with any questions.    Todd Gant, APRN  Pager [phone]

## 2016-11-27 NOTE — Progress Notes
OCCUPATIONAL THERAPY  ASSESSMENT NOTE    Patient Name: Todd Fischer                   Room/Bed: VQ2595/63  Admitting Diagnosis: Brain mass  Brain mass    Past Medical History:   Diagnosis Date   ??? Symptoms involving digestive system     reflux   ??? Unspecified disease of respiratory system     asthma/chronic bronchitis     Mobility  Progressive Mobility Level: Walk in room  Level of Assistance: Stand by assistance    Subjective  Pertinent Dx per Physician: resection of large L frontal mass 5/30  Comments: Pt without c/o pain, but requested saline for R eye, given by RN    Objective  Psychosocial Status: Willing and Cooperative to Participate  Persons Present: Spouse    Home Living  Type of Home: House  Comment: few steps to enter with rails, stairs to lower level with rails. Tub shower. No equipment but wife can put a folding chair in shower. Wife able to provide 70* supervision and was doing so prior to admit.    Vision  Current Vision: No Visual Deficits    ADL's  Comment: Standby assist for oral hygiene at sink, dropped toothpaste with L hand, able to bend down to retrieve. Forgot to turn off the water, also forgot to wipe mouth and required repeated cueing to look in mirror to accurately clean face. Urinated in toilet independently. Pt up ad lib in room. Moving quickly, mildly unsteady but no loss of balance. Pt impulsive and with short attention span. Wife aware and providing supervision. Pt aware he cannot drive or operate machinery at this time. Pt is R handed, has decreased coordination in L but denies sensory deficit. AROM and strength grossly WFL. Pt and wife expressed interest in outpatient therapy and OT concurs that this is a good plan.     Cognition  Overall Cognitive Status: Impaired  Cognition Comment: see above. Recommend SLP eval/tx as an outpatient.    Education  Persons Educated: Patient/Family  Teaching Methods: Verbal Instruction  Patient Response: Verbalized and Demo Understanding;More Instruction Required  Topics: Role of OT, Goals for Therapy;Home safety    Assessment  No Skilled OT: No Acute OT Goals Identified;Safe To Return Home (with consistent supervision)    AM-PAC 6 Clicks Daily Activity Inpatient  Putting on and taking off regular lower body clothes?: A Little  Bathing (Including washing, rinsing, drying): A Little  Toileting, which includes using toilet, bedpan, or urinal: A Little  Putting on and taking off regular upper body clothing: A Little  Taking care of personal grooming such as brushing teeth: A Little  Eating meals?: A Little  Daily Activity Raw Score: 18  Standardized (t-scale) score: 38.66  CMS 0-100% Score: 46.65  CMS G Code Modifier: CK     OT Discharge Recommendations  OT Discharge Recommendations: Home with family assist, Home with consistent supervision, Home setting w/Outpatient Therapy  Equipment Recommendations: Shower Chair, Grab Bars     G-Codes: Self-care  [phone] Current Status:40-59% Impairment    [id number] Goal Status: 40-59% Impairment   [id number] Discharge Status: 40-59% Impairment     Based on above evaluation and clinical judgment.      Therapist: Simonne Come, OT  Date: 11/27/2016

## 2016-11-27 NOTE — Progress Notes
Domino MD, Neurosurgery team notified of patients BP 184/98. 10 mg of hydralazine ordered and given. Will continue to monitor.

## 2016-11-28 NOTE — Telephone Encounter
[facility] KitchenNeuro-Tumor Navigation Intake Assessment Document    Patient Name:  Todd Fischer  MRN:  [id number]  DOB:  11-12-1960  Insurance:   [employer]    Appointment Info: [facility] Kitchen  Future Appointments  Date Time Provider Department Center   12/02/2016 9:30 AM Royal Hawthorn, MD [facility]   12/11/2016 12:30 PM NEUROSURGERY NP [facility]   12/24/2016 11:45 AM Racheal Patches, MD [facility]     Diagnosis & Reason for Visit:  Glioblastoma    Physician Info:   ??? Referring Physician:  Dr. Racheal Patches  ??? Phone/Fax: [phone] / [phone]  ??? Surgeon:  Dr. Racheal Patches  ??? Phone/Fax: [phone] / [phone]  ??? PCP:  Dr. Fawn Kirk    Location of Films:  IN HOUSE and PACS    Location of Pathology:  IN HOUSE    History of Present Illness/Treatment Timeline:      11/20/2016 - ADMISSION TO [facility] - [name] is a 56 y.o. male with HTN, HLD, COPD, 30 pack year smoking history presents as transfer with CT demonstrating right frontal mass and with left facial droop and reported confusion from family.    CT CAP w/ - IMPRESSION: Chest: 1. Marked upper lobe predominant bullous emphysema. Fluid fills one of the bulla compatible with infection. CT-guided aspiration of the fluid with laboratory analysis is recommended. 2. There is a small right upper lobe nodule and several tiny bilateral  pulmonary nodules. These may be granulomas or scars. The right upper lobe nodule measures up to 1.0 cm and may be evaluated further by PET/CT.  Short-term follow-up chest CT in 2-3 months is recommended for evaluation of the other tiny nodules. 3. Mild right hilar lymphadenopathy which may be reactive. This can also be assessed on PET/CT. 4. At least moderate coronary artery and mitral annulus calcification. Abdomen/pelvis: 1. Round enhancing lesion in the right lobe of the liver. The appearance is nonspecific and incompletely characterized by single phase abdominal CT.Abdominal MRI evaluation is recommended. 2. No abdominal or pelvic lymphadenopathy or ascites.    MRI HEAD w/wo - IMPRESSION: Redemonstration of the large right frontal lobe mass with small areas of hemorrhage, peripheral enhancement and central necrosis. This results in mild right to left midline shift.    11/21/2016 - ONC CONSULT -  Recommend proceeding to surgery for resection of the frontal lobe mass.Given the size of the lesion resection will be the most effective way to treat. This is more likely to be primary brain neoplasm as apposed to metastatic disease but will work up the liver lesion with MRI. The lung mass and Hilar LN can be evaluated with PET, however patient can proceed to surgery over the weekend if the PET cannot be done before Tuesday.    11/22/2016 - MRI ABD w/wo - IMPRESSION: Hepatic segment 8 lesion, consistent with focal nodular hyperplasia, a benign entity.    11/25/2016 - RESECTION - Awake right frontal craniotomy, resection of brain tumor.    PATHOLOGY [phone]):  Final Diagnosis:   A. Fibroadipose tissue, scalp mask, awake right frontal craniotomy: Benign fibroadipose tissue ???     B. 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 ??? ???     C. 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 ??? ??? ???     Comment: The Ki-67/MIB-1 proliferation index is increased and ranges from 15-20%.    11/26/2016 -  MRI HEAD w/wo - IMPRESSION: 1. ???Interval resection of large right frontal lobe mass with small amount of curvilinear enhancement along the posterior margin as well as punctate area of enhancement along the superomedial margin of the operative cavity, which may be postsurgical in nature or represent residual tumor. 2. ???Expected postoperative blood products, fluid, edema, and pneumocephalus within and adjacent to the operative cavity as well as within the extra-axial space. 3. ???Abnormal FLAIR hyperintensity within the posterior left thalamus, left cerebral cortex, and deep left periventricular white matter. Differential diagnoses include synchronous areas of nonenhancing tumor, sequela of seizures, or subacute ischemic foci where diffusion has normalized. Attention on follow-up is suggested.    11/27/2016 - DISCHARGE    NEEDS Assessment:    ??? Genetic Counseling:  Not Applicable        ??? Nutrition:  No needs identified                               ??? Social Work/Financial:  No need identified                      ??? Spiritual & Emotional:  No needs identified    ??? Physical:  No needs identified    ??? Fall Risk: No                                  ??? Communication:  No needs identified    ??? Oncofertility - Females age 58 and under; Males age 10 and under :  Not applicable    Referral Type: Hospital DC.    Comments:  I spoke with patient's wife and she states the patient is doing well. His appetitive has returned. I have scheduled them with Dr. Milas Kocher as the pathology will be final. She was very appreciative.  We discussed appointment time and location.    Deberah Pelton MSN, APRN, AGCNS-BC  Neuro Tumor Navigator  [facility]  phone. [phone]  fax. [phone]

## 2016-12-01 ENCOUNTER — Encounter: Admit: 2016-12-01 | Discharge: 2016-12-02 | Payer: MEDICARE

## 2016-12-01 NOTE — Discharge Instructions - Pharmacy
Physician Discharge Summary      Name: Todd Fischer  Medical Record Number: [id number]        Account Number:  [id number]  Date Of Birth:  06-23-1961                         Age:  56 years   Admit date:  11/20/2016                     Discharge date:  11/27/2016    Attending Physician:  Racheal Patches, MD               Service: [facility]    Physician Summary completed by: Blima Rich, APRN    Reason for hospitalization: Brain tumor    Significant PMH:   Past Medical History:   Diagnosis Date   ??? Symptoms involving digestive system     reflux   ??? Unspecified disease of respiratory system     asthma/chronic bronchitis          Allergies: Patient has no known allergies.    Admission Physical Exam notable for:    56 y.o. male with hypertension, hyperlipidemia, COPD, and a 30-pack-year smoking history presents as a transfer from outside hospital.  Patient presented outside hospital with complaints of headache, left facial droop, and reports by family of being increasingly confused over the last month.  Outside hospital obtained CT head which demonstrated a right frontal mass.  Patient was then transferred to  for further care management. Patient reports that his headache started about 1 month ago, diffuse throughout his head.  Patient reports that he did not notice the left facial droop until 1-2 weeks ago.  Patient has also noticed within the last week and a slight difficulty with swallowing and drooling.  Per family, patient has been more forgetful last month forgetting to close the fridge and other daily tasks that the patient does not normally forget.  Patient denies any nausea, vomiting, numbness, weakness, tingling, or change in vision. Admitted for further definitive management 11/20/16.  ???      Admission Lab/Radiology studies notable for: CT head    Brief Hospital Course:  The patient was admitted and the following issues were addressed during this hospitalization: (with pertinent details). Admitted 11/20/16. Oncology consulted and followed throughout admission. CT chest/abdomen/pelvis completed without primary source. OR 11/25/16 Awake right frontal craniotomy, resection of brain tumor. Admitted to Neuro ICU, Neuro Critical Care consulted to assist with patient management. POD 1 patient with difficulty swallowing, SLP consulted and recommended modified diet. MRI head completed with expected resection. Patient progressed to floor status. Incision remained C/D/I without redness or drainage. Patient discharged to home 11/27/16.      Condition at Discharge: Stable    Discharge Diagnoses:       Hospital Problems        Active Problems    Brain mass    Brain compression (HCC)    Vasogenic brain edema (HCC)    Glioblastoma, IDH-wildtype (HCC)          Surgical Procedures:  11/25/16 Awake right frontal craniotomy, resection of brain tumor.     Significant Diagnostic Studies and Procedures: radiology: MRI: head    Consults:  Oncology and Neuro Critical Care    Patient Disposition: Home   Outpatient PT/OT/SLP    Patient instructions/medications:     Activity as Tolerated   It is important to keep increasing your activity level after you  leave the hospital.  Moving around can help prevent blood clots, lung infection (pneumonia) and other problems.  Gradually increasing the number of times you are up moving around will help you return to your normal activity level more quickly.  Continue to increase the number of times you are up to the chair and walking daily to return to your normal activity level. Begin to work toward your normal activity level at discharge. As tolerated; No driving until cleared by your surgeon at follow up appointment. Avoid pulling, pushing or lifting greater than 10 pounds.     OT EVAL & TREAT   Eval and treat  2-3 times a weeks x 4-6 weeks  Dx brain tumor s/p craniotomy with resection of tumor  Do not lift push pull greater than 10 lbs. X 2 weeks.     PT EVAL & TREAT   Eval and treat 2-3 times a weeks x 4-6 weeks  Dx brain tumor s/p craniotomy with resection of tumor  Do not lift push pull greater than 10 lbs. X 2 weeks.     SLP CONSULT SPEECH-LANGUAGE EVAL & TX   Swallow eval currently on pureed with thin liquids.   Also speech language eval  Eval and treat  2-3 times a weeks x 4-6 weeks  Dx brain tumor s/p craniotomy with resection of tumor     Report These Signs and Symptoms   Please contact your doctor if you have any of the following symptoms: Call if temperature greater than 101, incision red, drainage or odor noted from incision, pain that is uncontrolled with pain medication, numbness or weakness, vision changes, trouble with speech or slurring of speech, or any questions/concerns.     Questions About Your Stay   For questions or concerns regarding your hospital stay. Call [phone]   Discharging attending physician: Racheal Patches [[id number]]      Pureed Diet   You should eat or drink foods that are easy to swallow because they are blended, whipped, or mashed to a pudding-like texture.  All foods on this diet should be smooth and free of lumps.  You are okay to drink regular liquids. May need to utilize Boost or Ensure type supplements to meet your caloric needs while on a modified diet.    If you have questions about your diet after you go home, you can call a dietitian at [phone].     Incision Care   *Keep your incision clean and dry.  *May get incision wet   *Do not submerge incision in tub, pool, hot tub, or lake for 6 weeks.  *Your incision should gradually look better each day. If you notice unusual swelling, redness, drainage, have increasing pain at the site, or have a fever greater than 100 degrees, notify your physician immediately.        Current Discharge Medication List       START taking these medications    Details   acetaminophen (TYLENOL) 325 mg tablet Take 2 tablets by mouth every 4 hours as needed.  Refills: 0    PRESCRIPTION TYPE:  OTC dexamethasone (DECADRON) 2 mg tablet Take with food. Taper schedule: 4 mg every 8 hours for 2 days, then 4 mg every 12 hours for 2 days, then 2 mg every 12 hours for 2 days, then 2 mg every day for 2 days, then stop  Qty: 26 tablet, Refills: 0    PRESCRIPTION TYPE:  Print      levETIRAcetam (KEPPRA) 500 mg tablet  Take 1 tablet by mouth twice daily for 12 days.  Qty: 24 tablet, Refills: 0    PRESCRIPTION TYPE:  Print      oxyCODONE (ROXICODONE, OXY-IR) 5 mg tablet Take 1-2 tablets by mouth every 4 hours as needed  Qty: 75 tablet, Refills: 0    PRESCRIPTION TYPE:  Print      senna/docusate (SENOKOT-S) 8.6/50 mg tablet Take 1 tablet by mouth as Needed.  Refills: 0    PRESCRIPTION TYPE:  OTC          CONTINUE these medications which have been CHANGED or REFILLED    Details   aspirin EC 81 mg tablet Take 1 tablet by mouth at bedtime daily. Take with food. May resume 1 week after surgery  Qty: 90 tablet, Refills: 3    PRESCRIPTION TYPE:  No Print      lisinopril (PRINIVIL, ZESTRIL) 40 mg tablet Take 1 tablet by mouth daily.  Qty: 30 tablet, Refills: 1    PRESCRIPTION TYPE:  Print          CONTINUE these medications which have NOT CHANGED    Details   diphenhydrAMINE (BENADRYL ALLERGY) 25 mg tablet Take 25 mg by mouth at bedtime as needed.    PRESCRIPTION TYPE:  Historical Med      guaiFENesin LA (MUCINEX) 600 mg tablet Take 600 mg by mouth at bedtime daily.    PRESCRIPTION TYPE:  Historical Med      krill/om-3/dha/epa/phospho/ast (MEGARED OMEGA-3 KRILL OIL PO) Take 1 capsule by mouth daily.    PRESCRIPTION TYPE:  Historical Med      ranitidine (ZANTAC) 75 mg tablet Take 75 mg by mouth at bedtime daily.    PRESCRIPTION TYPE:  Historical Med      simvastatin (ZOCOR) 40 mg tablet Take 40 mg by mouth at bedtime daily.    PRESCRIPTION TYPE:  Historical Med      vitamins, multiple (MULTIPLE VITAMINS) tablet Take 1 tablet by mouth at bedtime daily.    PRESCRIPTION TYPE:  Historical Med The following medications were removed from your list. This list includes medications discontinued this stay and those removed from your prior med list in our system        fluticasone/salmeterol (ADVAIR DISKUS) 100/50 mcg inhalation disk        hydrocodone-acetaminophen (LORTAB 10/500) 10-500 mg per tablet               Scheduled appointments:    Dec 02, 2016  9:30 AM CDT  New Patient with Royal Hawthorn, MD  [facility] - Neurosurgery [facility] NeuroSurgery) 2nd Floor Pod [address]  [address] Med Office [place] [address]  [phone]   Dec 11, 2016 12:30 PM CDT  Post - Op with NEUROSURGERY NP CLINIC  [place] [facility] - Neurosurgery [facility] NeuroSurgery) 2nd Floor [place] [address]  [address] Med Office [place] [address]  [phone]   Dec 24, 2016 11:45 AM CDT  Post - Op with Racheal Patches, MD  [facility] - Neurosurgery [facility] NeuroSurgery) 2nd Floor [place] [address]  [phone] [address] Med Office [place] [address]  [phone]          Pending items needing follow up: none.    Signed:  Blima Rich, APRN  12/01/2016      cc:  Primary Care Physician:  Fawn Kirk   Verified  Referring physicians:  Self, Referral   Additional provider(s):

## 2016-12-02 ENCOUNTER — Ambulatory Visit: Admit: 2016-12-02 | Discharge: 2016-12-03 | Payer: MEDICARE

## 2016-12-02 DIAGNOSIS — C719 Malignant neoplasm of brain, unspecified: Principal | ICD-10-CM

## 2016-12-02 DIAGNOSIS — J439 Emphysema, unspecified: ICD-10-CM

## 2016-12-02 MED ORDER — SCOPOLAMINE BASE 1 MG OVER 3 DAYS TD PT3D
1 | MEDICATED_PATCH | TRANSDERMAL | 7 refills | Status: AC
Start: 2016-12-02 — End: 2016-12-09

## 2016-12-02 NOTE — Progress Notes
Neuro-oncology Clinic Visit Summary    Date of Service: 12/02/2016    Subjective:   Marcio Hoque is a 56 y.o. year-old male from [place], [place] with a right frontal Glioblastoma (IV).       Reason For Visit:  Neuro-Oncology Care    Treatment History:  11/20/2016 - ADMISSION TO [facility] - Joni Brutus???is a 56 y.o.???male???with HTN, HLD, COPD, 30 pack year smoking history presents as transfer with CT demonstrating right frontal mass and with left facial droop and reported confusion from family.  ???  CT CAP w/ - IMPRESSION: Chest: 1. Marked upper lobe predominant bullous emphysema. Fluid fills one of the bulla compatible with infection. CT-guided aspiration of the fluid with laboratory analysis is recommended. 2. There is a small right upper lobe nodule and several tiny bilateral  pulmonary nodules. These may be granulomas or scars. The right upper lobe nodule measures up to 1.0 cm and may be evaluated further by PET/CT.  Short-term follow-up chest CT in 2-3 months is recommended for evaluation of the other tiny nodules. 3. Mild right hilar lymphadenopathy which may be reactive. This can also be assessed on PET/CT. 4. At least moderate coronary artery and mitral annulus calcification. Abdomen/pelvis: 1. Round enhancing lesion in the right lobe of the liver. The appearance is nonspecific and incompletely characterized by single phase abdominal CT.Abdominal MRI evaluation is recommended. 2. No abdominal or pelvic lymphadenopathy or ascites.  ???  MRI HEAD w/wo - IMPRESSION: Redemonstration of the large right frontal lobe mass with small areas of hemorrhage, peripheral enhancement and central necrosis. This results in mild right to left midline shift.  ???  11/21/2016 - ONC CONSULT - ???Recommend proceeding to surgery for resection of the frontal lobe mass.Given the size of the lesion resection will be the most effective way to treat. This is more likely to be primary brain neoplasm as apposed to metastatic disease but will work up the liver lesion with MRI. The lung mass and Hilar LN can be evaluated with PET, however patient can proceed to surgery over the weekend if the PET cannot be done before Tuesday.  ???  11/22/2016 - MRI ABD w/wo - IMPRESSION: Hepatic segment 8 lesion, consistent with focal nodular hyperplasia, a benign entity.  ???  11/25/2016 - RESECTION - Awake right frontal craniotomy, resection of brain tumor.  ???  PATHOLOGY ([id number]):  Final Diagnosis:   A. Fibroadipose tissue, scalp mask, awake right frontal craniotomy: Benign fibroadipose tissue ???     B. 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 ??? ???     C. 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 ??? ??? ???     Comment: The Ki-67/MIB-1 proliferation index is increased and ranges from 15-20%.  ???  11/26/2016 - MRI HEAD w/wo - IMPRESSION: 1. ???Interval resection of large right frontal lobe mass with small amount of curvilinear enhancement along the posterior margin as well as punctate area of enhancement along the superomedial margin of the operative cavity, which may be postsurgical in nature or represent residual tumor. 2. ???Expected postoperative blood products, fluid, edema, and pneumocephalus within and adjacent to the operative cavity as well as within the extra-axial space. 3. ???Abnormal FLAIR hyperintensity within the posterior left thalamus, left cerebral cortex, and deep left periventricular white matter. Differential diagnoses include synchronous areas of nonenhancing tumor, sequela of seizures, or subacute ischemic foci where diffusion has normalized. Attention  on follow-up is suggested.  ???  11/27/2016 - DISCHARGE    Interval History:  Gailard Romulus is seen today in the company of his wife.  Recovering slowly from surgery, chronic (smoker's) cough persists, fatigue persists.            Review of Systems ROS is conducted in all 12 systems and, except as noted above in HPI, is summarized below:    No headaches, no vision changes.  No difficulty with speech or swallowing.  No chest pain, back pain, no breathing difficulties.  No abdominal pain.  No extremity edema  No neurologic changes, specifically weakness or sensory changes  No recent seizures, no recent falls  No fevers, chills or sweats. No cough  No nausea, vomiting, diarrhea or constipation  No unintended weight loss.  No excessive fatigue.  No skin changes or rash  No depression complaints    Past Medical History:     Past Medical History:   Diagnosis Date   ??? Glioblastoma, IDH-wildtype (HCC) 12/04/2016   ??? Symptoms involving digestive system     reflux   ??? Unspecified disease of respiratory system     asthma/chronic bronchitis     Past Surgical History:   Procedure Laterality Date   ??? CRANIOTOMY Right 11/25/2016    AWAKE RIGHT FRONTAL CRANIOTOMY FOR TUMOR RESECTION performed by Racheal Patches, MD at [facility] OR/Periop   ??? HX TONSILLECTOMY       Social History     Social History   ??? Marital status: Married     Spouse name: N/A   ??? Number of children: N/A   ??? Years of education: N/A     Social History Main Topics   ??? Smoking status: Current Every Day Smoker     Packs/day: 1.00     Years: 30.00     Types: Cigarettes   ??? Smokeless tobacco: Never Used   ??? Alcohol use Not on file   ??? Drug use: Unknown   ??? Sexual activity: Not on file     Other Topics Concern   ??? 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 He is married and lives with his wife. He has 2 kids and 3 grand kids. He was a heavy drinker but stopped drinking about 1 year ago. He is a current smoker but plans to stop now. He is independent and able to care for himself.      Current Medications:  ??? acetaminophen (TYLENOL) 325 mg tablet Take 2 tablets by mouth every 4 hours as needed. ??? aspirin EC 81 mg tablet Take 1 tablet by mouth at bedtime daily. Take with food. May resume 1 week after surgery   ??? dexamethasone (DECADRON) 2 mg tablet Take with food. Taper schedule: 4 mg every 8 hours for 2 days, then 4 mg every 12 hours for 2 days, then 2 mg every 12 hours for 2 days, then 2 mg every day for 2 days, then stop   ??? diphenhydrAMINE (BENADRYL ALLERGY) 25 mg tablet Take 25 mg by mouth at bedtime as needed.   ??? guaiFENesin LA (MUCINEX) 600 mg tablet Take 600 mg by mouth at bedtime daily.   ??? krill/om-3/dha/epa/phospho/ast (MEGARED OMEGA-3 KRILL OIL PO) Take 1 capsule by mouth daily.   ??? levETIRAcetam (KEPPRA) 500 mg tablet Take 1 tablet by mouth twice daily for 12 days.   ???  lisinopril (PRINIVIL, ZESTRIL) 40 mg tablet Take 1 tablet by mouth daily.   ??? oxyCODONE (ROXICODONE, OXY-IR) 5 mg tablet Take 1-2 tablets by mouth every 4 hours as needed   ??? ranitidine (ZANTAC) 75 mg tablet Take 75 mg by mouth at bedtime daily.   ??? scopolamine (TRANSDERM-SCOP) 1.5 mg 3 day patch Apply 1 patch to top of skin as directed every 72 hours.   ??? senna/docusate (SENOKOT-S) 8.6/50 mg tablet Take 1 tablet by mouth as Needed.   ??? simvastatin (ZOCOR) 40 mg tablet Take 40 mg by mouth at bedtime daily.   ??? vitamins, multiple (MULTIPLE VITAMINS) tablet Take 1 tablet by mouth at bedtime daily.     Allergies:  No Known Allergies    Vitals:  Vitals:    12/02/16 0907   BP: 149/77   Pulse: 62   Resp: 16   Temp: 36.7 ???C (98.1 ???F)   TempSrc: Oral   SpO2: 97%   Weight: 83.7 kg (184 lb 9.6 oz)   Height: 182.9 cm (72)     Body surface area is 2.06 meters squared.    Pain Addressed: No pain complaints today, pain score = 0/10.    [place] Cooperative Oncology Group performance status is 2.      Physical Exam    General-  This is a well-nourished, well developed male who appears in no acute distress.   Mood and affect are appropriate for the situation   Speech is fluent and cognition appears grossly intact. Ambulatory around the clinic slowly, but without gross difficulty.    Head - Normocephalic, with a well healed surgical incision, no erythema or drainage    Eyes - Pupils are equal, extra-ocular movements are intact.  Visual fields are grossly intact to testing    Mouth - Oral mucosa pink and moist, no lesions.    Neck - No cervical lyphadenopathy, thyroid non-remarkable to palpation.    Lungs (auscultation) - Clear bilaterally (distant) with good air movement.    Heart (auscultation) - Regular rate and rhythm, no murmors    Abdomen - Soft, non tender, no masses, positive bowel sounds.    Lymph Node Exam/Extremities - No palpable adenopathy, no extremity edema.    Derm - No rashes or lesions    Neurologic -    Cranial nerves II - XII are grossly intact    Strength is 5/5 in all muscle groups in all four extremities.    Sensation is subjectively intact    Balance function, including finger-nose-finger and gait testing are unremarkable.    Laboratory and Imaging Results ([place] system):     SURGICAL PATHOLOGY             Collection Time: 11/25/16 10:12 AM   Result Value Ref Range    PATHOLOGY REPORT       [facility]  [email or web address]    Department of Pathology and Laboratory Medicine  [address] [place], [place] [address]  Surgical Pathology Office:  [phone]  Fax:  [phone]  SURGICAL PATHOLOGY REPORT    NAME: YAPHET, MELNYK PATH #: [id number] MR #: [id number] SPECIMEN CLASS: SCA  BILLING #: [id number] ALT ID #:  LOCATION: DISCHARGED DATE OF PROCEDURE:  11/25/2016 AGE:  56 SEX: M DATE RECEIVED: 11/25/2016 DOB: 03-13-1961  TIME  RECEIVED:  10:12 PHYSICIAN: Racheal Patches, MD DATE OF REPORT: 11/27/2016  COPY TO:  DATE OF PRINTING: 12/01/2016   Procedures/Addenda  Addendum     Date Ordered:  12/01/2016     Status:  Signed Out  Date Complete:     12/01/2016  By: Jonna Clark, MD  Date Reported:     12/01/2016           Addendum Diagnosis  Diagnosis is unchanged.     Addendum Comment Pursuant to the Quality Assurance Program at the [facility] Pathology Department, selected slides from this case have been  concurrently reviewed by the following path ologist: Dr. Kerry Hough who  agrees with the final diagnosis.    IDH1/IDH2 sequencing analysis has been ordered and will be reported in a  subsequent addendum.        Jonna Clark, MD          ########################################################################  Final Diagnosis:    A. Fibroadipose tissue, scalp mask, awake right frontal craniotomy:  Benign fibroadipose tissue      B. 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         C. 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           Comment:  The Ki-67/MIB-1 proliferation index is increased and ranges from 15-20%.     BRAIN/Resection  History of Previous Tumor/Familial Syndrome  None known  Specimen Type/Procedure  Resection  Specimen Handling  Intraoperative smears and frozen sections  Routine permanent paraffin sections  Specimen Size  Greatest dimens ion: 7.1 cm  Laterality  Right  Tumor Site  Brain/cerebrum  Histologic Type  Astrocytic Tumors  Glioblastoma, IDH1-R132H Wild-type   WHO Histologic Grade  WHO grade IV  Additional Pathologic Studies   Immunohistochemistry   Block C5: GFAP, Ki-67/MIB-1, p53, IDH1-R132H  (see microscopic description for additional details)    The pathologic stage assigned here should be regarded as provisional, as  it reflects only current pathologic data and does not incorporate full  knowledge of the patient's clinical status and/or prior pathology.    REFERENCE: WHO Classification of Tumours of the [employer] System  (2016, IARC press, [place])    Note: Full assessment of IDH mutation status requires sequence analysis for IDH1 codon 132 and IDH2 codon 172 mutations in cases that are  immunohistochemically negative for the IDH1 R132H mutation. However, in  the setting of negative IDH1-R132H mutant immunohistochemistry in a  glioblastoma from a patient without a prior lower-grade glioma, the  probabi lity of an alternative IDH mutation is <35% in a 56 year-old patient  and decreases to <1% in patients aged >54 years. Alberteen Sam al 2014,  Neuro Oncol 16 [phone].     Block for molecular: C5>C4>C3    Pursuant to the Quality Assurance Program at the [facility] Pathology Department, selected slides from this case will be  reviewed by another neuropathologist and reported in an addendum.        Attestation:  By this signature, I attest that I have personally formulated the final  interpretation expressed in this report and that the above diagnosis is  based upon my examination of the slides and/or other material indicated in  this report.    +++Electronically Signed Out By Jonna Clark, MD on 11/27/2016+++             ksw/11/26/2016             ########################################################################  Material Received:  A: scalp  mask  B: right frontal tumor  C: right frontal brain tumor    History:  56 year old male who presented to an outside hospita l with headache, left  facial droop, and increased confusion. An outside CT head showed a right  frontal mass. He was transferred to [facility] where an MRI of the brain showed  a large necrotic mass in the right frontal lobe with extensive vasogenic  edema and peripheral enhancement. He presents for surgical resection.       Microscopic Description:  Sections of parts B. and C. show predominantly necrosis (part B also shows  extensive cautery) with scattered areas of hypercellularity, atypical  glial cells, numerous mitoses, endothelial/microvascular proliferation and  some pseudopalisading necrosis.       Immunohistochemistry - Block C5: GFAP: positive, highlights the endothelial/microvascular proliferation  Ki-67/MIB1: increased, ranges from 15-20%  IDH1-R132H: negative  p53: positive, approximately 50%    Gross Description:  Received in formalin labeled with the patient's name and scalp mask is a  3.2 x 2.8 x 0.5 cm portion of tan-yellow lobulated soft tissue. The  specimen is section ed to reveal yellow-tan homogeneous cut surface. The  specimen is submitted entirely in cassettes A1-A3. (sc)    B. Received fresh, labeled with the patient's name and right frontal  tumor is a 4.2 x 1.9 x 0.5 cm aggregate of tan-pink to white soft tissue  fragments. A representative section is submitted for frozen consultation  with the remnant being placed in cassette B1FS. The remainder of the  specimen is submitted in cassettes B2-B5. (ksh)    C. Received in formalin, labeled with the patient's name and right  frontal brain tumor are multiple, irregular, unoriented portions of  friable neural tissue measuring in aggregate 7.1 x 4.3 x 1.9 cm. The  specimen is serially sectioned to reveal a friable, tan-gray to tan-white  cut surface. Representative sections of the specimen are submitted in  cassettes C1-C5. (gkc)    sc/11/25/2016  Intraoperative Consultation:  B1FS, smears, brain, right frontal tumor, biopsy:  High-grade glioma on smears. Necrosis predominantly on frozen  secti ons.    Note: A cytological smear/squash (B1TP) was performed on different areas  from the specimen and used together with the frozen sections (B1FS) for  optimal evaluation.    Frozen section performed at the [facility], [address],  [address], [place], [place] [address].  Jonna Clark, MD    If immunohistochemical stains and/or in situ hybridization are cited in  this report, the performance characteristics were determined by the  Department of Pathology and Laboratory Medicine of the [facility] [place] [facility]) in compliance with CLIA'88  regulations.  Some of these tests rely on the use of analyte specific  reagents and are subject to specific labeling requirements by the FDA.   Known positive and negative control tissues demonstrate appropriate  staining.  Results should be interpreted with caution given the likelihood  of false negativity on decalcified specimens.  This testing was developed  by the Department of Pathology and Lab oratory Medicine of the [place]  [facility] [place].  It has not been cleared or approved by the FDA.  The FDA has  determined that such clearance or approval is not necessary.     CBC AND DIFF    Collection Time: 11/27/16  5:48 AM   Result Value Ref Range    White Blood Cells 11.9 (H) 4.5 - 11.0 K/UL    RBC 4.01 (L) 4.4 -  5.5 M/UL    Hemoglobin 12.4 (L) 13.5 - 16.5 GM/DL    Hematocrit 16.1 (L) 40 - 50 %    MCV 91.2 80 - 100 FL    MCH 30.8 26 - 34 PG    MCHC 33.7 32.0 - 36.0 G/DL    RDW 09.6 11 - 15 %    Platelet Count 206 150 - 400 K/UL    MPV 8.7 7 - 11 FL    Neutrophils 85 (H) 41 - 77 %    Lymphocytes 9 (L) 24 - 44 %    Monocytes 6 4 - 12 %    Eosinophils 0 0 - 5 %    Basophils 0 0 - 2 %    Absolute Neutrophil Count 10.10 (H) 1.8 - 7.0 K/UL    Absolute Lymph Count 1.00 1.0 - 4.8 K/UL    Absolute Monocyte Count 0.70 0 - 0.80 K/UL    Absolute Eosinophil Count 0.00 0 - 0.45 K/UL    Absolute Basophil Count 0.00 0 - 0.20 K/UL   BASIC METABOLIC PANEL    Collection Time: 11/27/16  5:48 AM   Result Value Ref Range    Sodium 134 (L) 137 - 147 MMOL/L    Potassium 4.0 3.5 - 5.1 MMOL/L    Chloride 102 98 - 110 MMOL/L    CO2 24 21 - 30 MMOL/L    Anion Gap 8 3 - 12    Glucose 148 (H) 70 - 100 MG/DL    Blood Urea Nitrogen 24 7 - 25 MG/DL    Creatinine 0.45 0.4 - 1.24 MG/DL    Calcium 9.4 8.5 - 40.9 MG/DL    eGFR Non African American >60 >60 mL/min    eGFR African American >60 >60 mL/min          Assessment and Plan:  Patient Active Problem List Diagnosis Date Noted   ??? Glioblastoma, IDH-wildtype (HCC) 12/04/2016   ??? Brain compression (HCC) 12/01/2016   ??? Vasogenic brain edema (HCC) 12/01/2016   ??? Brain mass 11/20/2016       Glioblastoma (IV) - Mr Holzhausen is recovering slowly from surgery, has a bit of persistent fatigue and improving incisional pain.  He is undergoing steroid taper as well.  We discussed the pathology of glioblastoma, the infiltrative nature of these tumors and non curability with conventional therapies.  Discussed the role for adjuvant radiation + temozolomide concurrent and adjuvant.  Discussed the MRI scans pre- and post-op demonstrating a gross total resection of enhancing tumor.  He lives close to [employer], [place], will make referral there for rad/onc and med-onc, plan to see him back post radiation with repeat MRI scan.    Bulla, fluid filled - I will plan to speak with pulmonary and ID today to understand management better.  While his risk for cytopenias is low, maybe 10-15% with temozolomide, it is not zero, and if he does become thrombocytopenic or neutropenic this could certainly be a problem.  Further recommendations to follow.       I spent a total of 70 minutes today in direct patient evaluation, from 0930 to 1040, with more than 40 minutes spent in treatment plan formulation, symptom management discussion and consultation as outlined above.

## 2016-12-03 ENCOUNTER — Encounter: Admit: 2016-12-03 | Discharge: 2016-12-03

## 2016-12-03 NOTE — Progress Notes
Interventional Radiology Outpatient Scheduling Checklist      1.  Procedure:   Right thoracentesis      2.  Date of Procedure:   12/09/16      3.  Arrival Time:  1200       4.  Procedure Time: 2248       2.  Correct Procedural Room Assignment:  [facility] [place]      6.  Blood Thinners Triaged and instructed per protocol: N/A      7.  Order Verified: Yes      8.  Patient informed regarding procedure: Yes      9.  Patient instructed to have a driver:  Yes     10.  Patient instructed on NPO status: No solids 8 hours prior to procedure 0500, clear liquids till 2 hours prior to procedure 1100, then @ 2 hours prior to procedure NPO.      11.  Specimen needed: Y/N: Yes     12.  Allergies Verified:  Y/N:  Yes     13.  Iodine Allergy: Y/N:  If yes and receiving Iodine has the patient been premedicated: Y/N: No     14.  Does the patient have labs according to IR procedural policy: Y/N: Yes     15.  Will the patient need to be admitted/possible admission: Y/N:   NA     16.  If YES to possible admission has the patient been instructed: Y/N: NA     17.  Patient States Understanding:  Y/N  Yes     18.  Hx OSA:  Y/N: Pt instructed to bring PAP Machine:  NA

## 2016-12-04 ENCOUNTER — Encounter: Admit: 2016-12-04 | Discharge: 2016-12-04

## 2016-12-04 DIAGNOSIS — R198 Other specified symptoms and signs involving the digestive system and abdomen: Principal | ICD-10-CM

## 2016-12-04 DIAGNOSIS — J989 Respiratory disorder, unspecified: ICD-10-CM

## 2016-12-04 DIAGNOSIS — C719 Malignant neoplasm of brain, unspecified: ICD-10-CM

## 2016-12-09 ENCOUNTER — Encounter: Admit: 2016-12-09 | Discharge: 2016-12-09

## 2016-12-09 ENCOUNTER — Ambulatory Visit: Admit: 2016-12-09 | Discharge: 2016-12-09

## 2016-12-09 ENCOUNTER — Ambulatory Visit: Admit: 2016-12-09 | Discharge: 2016-12-10

## 2016-12-09 ENCOUNTER — Ambulatory Visit: Admit: 2016-12-09 | Discharge: 2016-12-10 | Payer: MEDICARE

## 2016-12-09 ENCOUNTER — Ambulatory Visit: Admit: 2016-12-09 | Discharge: 2016-12-09 | Payer: MEDICARE

## 2016-12-09 DIAGNOSIS — J45909 Unspecified asthma, uncomplicated: ICD-10-CM

## 2016-12-09 DIAGNOSIS — J438 Other emphysema: ICD-10-CM

## 2016-12-09 DIAGNOSIS — C719 Malignant neoplasm of brain, unspecified: ICD-10-CM

## 2016-12-09 DIAGNOSIS — J439 Emphysema, unspecified: Principal | ICD-10-CM

## 2016-12-09 DIAGNOSIS — F17210 Nicotine dependence, cigarettes, uncomplicated: ICD-10-CM

## 2016-12-09 DIAGNOSIS — J90 Pleural effusion, not elsewhere classified: Principal | ICD-10-CM

## 2016-12-09 MED ORDER — DIAZEPAM 5 MG PO TAB
5 mg | Freq: Once | ORAL | 0 refills | Status: CP
Start: 2016-12-09 — End: ?
  Administered 2016-12-09: 18:00:00 5 mg via ORAL

## 2016-12-09 MED ORDER — FENTANYL CITRATE (PF) 50 MCG/ML IJ SOLN
50 ug | Freq: Once | INTRAVENOUS | 0 refills | Status: DC
Start: 2016-12-09 — End: 2016-12-09

## 2016-12-09 MED ORDER — MIDAZOLAM 1 MG/ML IJ SOLN
1-2 mg | Freq: Once | INTRAVENOUS | 0 refills | Status: DC
Start: 2016-12-09 — End: 2016-12-09

## 2016-12-09 MED ORDER — OXYCODONE 5 MG PO TAB
5-15 mg | ORAL_TABLET | ORAL | 0 refills | 6.00000 days | Status: AC | PRN
Start: 2016-12-09 — End: 2016-12-24

## 2016-12-09 NOTE — Progress Notes
Immediate post procedure chest xray reviewed by Dr Emelda Brothers. No abnormalities noted. Dr Emelda Brothers came to pt bedside to explain procedure and results. Will obtain one hour post chest xray.

## 2016-12-09 NOTE — Progress Notes
One hour post chest xray reviewed by Dr Gerome Apley, no abnormalities noted. Pt discharged to home via wheelchair transport.

## 2016-12-09 NOTE — Other
Immediate Post Procedure Note    Date:  12/09/2016                                         Attending Physician: Dr. Tina Griffiths   Performing Provider:  Ivan Croft, MD    Consent:  Consent obtained from patient.  Time out performed: Consent obtained, correct patient verified, correct procedure verified, correct site verified, patient marked as necessary.  Pre/Post Procedure Diagnosis: Infected Bullae   Indications: Infection    Anesthesia: Lidocaine   Procedure(s): Right apical bullae aspiration   Findings: 10 cc purulent fluid aspirated. No drain placed.     Estimated Blood Loss:  None/Negligible  Specimen(s) Removed/Disposition:  Yes, sent to pathology  Complications: None  Patient Tolerated Procedure: Well  Post-Procedure Condition:  stable    Ivan Croft, MD

## 2016-12-09 NOTE — Progress Notes
Procedure unable to be completed with [place], patient moved to CT scanner in IR procedure room 5.

## 2016-12-09 NOTE — Progress Notes
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 Patient continues having moderate to severe incisional pain. Patient states his pain is worse in the mornings when he wakes up. He states he is able to sleep fully through the night, and tries to take 10 mg of oxy and 1000 mg of oxy before bed, but still wakes up with a moderate headache and incisional pain. Patient otherwise complains of a constant mild headache throughout the day. Patient is currently taking 4-5 47m oxy/day and 20059mof tylenol/day.    O:  Patient at baseline on brief neurological exam. Staples removed today. Incision c/d/i, mild redness likely due to staple removal. Small, non tender, non tense fluid collection under skin.     A/P:   [age]o male s/p crani for mass resection, with continued moderate headache    - headache seems to be worse when he goes for extended time without taking meds, for example after sleeping  - patient is comfortable on exam  - consulted patient on pain meds with instructions to increase tylenol to [id number] PRN  - will prescribe 5-15 mg oxy q4 PRN  - instructed patient on safe use of oxy, only take if needs, and try to start slowly backing off dosage and going longer without taking medication as pain allows.  - will see at scheduled follow up with Dr. ChaEnrique Sackr reassessment    Please call [phone] questions or concern    KevMarcellus ScottD

## 2016-12-09 NOTE — Progress Notes
Neuro-oncology Clinic Progress Note:    Date of Service: 12/09/2016    Interval History:  Todd Fischer is seen today in the company of his wife.  Still having incisional pain, some subgaleal fluid collection and fatigue.          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  He is also being seen by neurosurgery after me, for staple removal and to address pain issues.  On exam, CN 2-12 are intact, strength is 5/5 x 4, sensation is intact as are gait and balance.  Cognition is grossly intact.  ECOG PS = 0 and pain = 0/10.  He has follow up set in [place] with rad/onc and med-onc in the next week or two.  Follow up with me about 2-3 weeks post completion of radiation with repeat MRI scan.    Fluid filled bulla of right lung - pending IR aspiration.  I have discussed last week with pulmonary, ID and interventional radiology.  Given the risk for chemo induced cytopenias, and the low risk of aspiration which would yield definitive infective microorganisms as well as therapeutically reducing the treatment volume, the patient has elected to proceed with aspiration of fluid collection later today.  Further recommendations to follow on this.      I spent a total of 15 minutes today in direct patient evaluation, from 0820 to 0835, with more than 10 minutes spent in treatment plan formulation, symptom management discussion and consultation as outlined above.

## 2016-12-09 NOTE — Progress Notes
Will see him back 2-4 weeks after the completion of radiation and we will see him back with an MRI and follow up. Wife will call us with the date of his last day of radiation.

## 2016-12-09 NOTE — Progress Notes
Vital signs q 15 minutes x 4 then resume prior order. Bed rest until second chest x-ray cleared by physician. Bed rest with bathroom privileges x 24 hours then resume activity as tolerated. NPO until second chest x-ray cleared by physician. Remove dressing from catheter site in 24 hours. Chest tube maintenance if applicable.  Imaging: Order and complete inspiration/expiration chest X-ray immediately post procedure and in one hour.

## 2016-12-09 NOTE — H&P (View-Only)
Pre-Procedure History and Physical/Sedation Plan    Procedure Date: 12/09/2016     Planned Procedure(s):  Ultrasound guided R thoracentesis.    Indication:  R pleural effusion.  __________________________________________________________________    Chief Complaint:  Pleural effusion    History of Present Illness: Todd Fischer is a 56 y.o. male with a history of Glioblastoma and Bulla of lung who presents today for procedure.    Patient Active Problem List    Diagnosis Date Noted   ??? 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 Glioblastoma, IDH-wildtype ([facility]) 12/04/2016     Past Medical History:   Diagnosis Date   ??? Glioblastoma, IDH-wildtype (HCC) 12/04/2016   ??? Symptoms involving digestive system     reflux   ??? Unspecified disease of respiratory system     asthma/chronic bronchitis      Past Surgical History:   Procedure Laterality Date   ??? CRANIOTOMY Right 11/25/2016    AWAKE RIGHT FRONTAL CRANIOTOMY FOR TUMOR RESECTION performed by Racheal Patches, MD at [facility] OR/Periop   ??? HX TONSILLECTOMY          (Not in a hospital admission)  No Known Allergies    Social History:   Social History   Substance Use Topics   ??? Smoking status: Current Every Day Smoker     Packs/day: 1.00     Years: 30.00     Types: Cigarettes   ??? Smokeless tobacco: Never Used   ??? Alcohol use Not on file      No family history on file.     Review of Systems  A comprehensive review of systems was negative.    Previous Personal Anesthetic/Sedation History:  Denies adverse events related to sedation/anesthesia.     Previous Family Anesthetic/Sedation History: Denies adverse events related to sedation/anesthesia.    Physical Exam:  Vital Signs: Last Filed In 24 Hours Vital Signs: 24 Hour Range   BP: 148/87 (06/13 1303)  Temp: 36.6 ???C (97.9 ???F) (06/13 1259)  Pulse: 69 (06/13 1259)  Respirations: 20 PER MINUTE (06/13 1259)  SpO2: 98 % (06/13 1259)  O2 Delivery: None (Room Air) (06/13 1259) BP: (138-148)/(74-87)   Temp:  [36.6 ???C (97.9 ???F)-36.7 ???C (98 ???F)] Pulse:  [67-69]   Respirations:  [17 PER MINUTE-20 PER MINUTE]   SpO2:  [98 %]   O2 Delivery: None (Room Air)          General appearance: alert and no distress noted.  Neurologic: Grossly normal.  Lungs: Non labored, diminished R lung base.  Heart: regular rate and rhythm      Airway: airway assessment performed  Mallampati II (soft palate, uvula, fauces visible)  Head and Neck: no abnormalities noted  Mouth: no abnormalities noted  NPO status: Acceptable  Pregnancy Status: N/A  Anesthesia Classification:  ASA III (A patient with a severe systemic disease that limits activity, but is not incapacitating)  Sedation/Medication Plan: Local anesthetic  Discussion/Reviews:  Physician has discussed risks and alternatives of this type of sedation and above planned procedures with patient    Lab/Radiology/Other Diagnostic Tests:  Labs:  Pertinent labs reviewed             Lorelee Market, APRN-NP  Pager [phone]

## 2016-12-10 LAB — GRAM STAIN

## 2016-12-10 LAB — CELL COUNT W/DIFF-FLUIDS: Lab: 460 /uL

## 2016-12-11 ENCOUNTER — Ambulatory Visit: Admit: 2016-12-11 | Discharge: 2016-12-12 | Payer: MEDICARE

## 2016-12-11 DIAGNOSIS — Z5189 Encounter for other specified aftercare: Principal | ICD-10-CM

## 2016-12-11 MED ORDER — METHOCARBAMOL 750 MG PO TAB
750 mg | ORAL_TABLET | Freq: Three times a day (TID) | ORAL | 0 refills | Status: AC
Start: 2016-12-11 — End: ?

## 2016-12-11 NOTE — Progress Notes
Todd Fischer returns today for wound check.  He was seen on 12/09/2016 and his staples were removed.  Unfortunately, this appointment wasn't cancelled and he presented to our office.  He has completed his dexamethasone taper and post op Keppra.  His incision is healing well.  Edges well approximated.  No S/S of infection noted.   He reports he is doing well.  He has been seen by medical oncology and is scheduled to begin RT next week.   He will return for surgical f/u with Dr. Enrique Sack on 12/24/2016.

## 2016-12-14 LAB — CULTURE-WOUND/TISSUE/FLUID(AEROBIC ONLY)W/SENSITIVITY

## 2016-12-14 LAB — CULTURE-ANAEROBIC

## 2016-12-16 LAB — IDH2 NGS NON BLOOD

## 2016-12-16 LAB — IDH1 NGS NON BLOOD

## 2016-12-24 ENCOUNTER — Encounter: Admit: 2016-12-24 | Discharge: 2016-12-24

## 2016-12-24 ENCOUNTER — Ambulatory Visit: Admit: 2016-12-24 | Discharge: 2016-12-25 | Payer: MEDICARE

## 2016-12-24 DIAGNOSIS — C719 Malignant neoplasm of brain, unspecified: ICD-10-CM

## 2016-12-24 DIAGNOSIS — R198 Other specified symptoms and signs involving the digestive system and abdomen: Principal | ICD-10-CM

## 2016-12-24 DIAGNOSIS — J989 Respiratory disorder, unspecified: ICD-10-CM

## 2016-12-24 MED ORDER — GLYCOPYRROLATE 1 MG PO TAB
1 mg | ORAL_TABLET | Freq: Two times a day (BID) | ORAL | 1 refills | 90.00000 days | Status: AC
Start: 2016-12-24 — End: ?

## 2016-12-24 MED ORDER — OXYCODONE 5 MG PO TAB
5-15 mg | ORAL_TABLET | ORAL | 0 refills | 6.00000 days | Status: AC | PRN
Start: 2016-12-24 — End: 2017-06-24

## 2016-12-24 NOTE — Progress Notes
Todd Fischer returns today for 4 wk f/u s/p Awake right frontal craniotomy, resection of brain tumor on 11/25/2016 by Dr. Enrique Sack.  Pathology Consistent with:  GLIOBLASTOMA, IDH1 R132H-WILDTYPE, WHO GRADE IV.  He is being followed by Dr. Christene Slates.  He is scheduled to begin Fractionated Radiation with concurrent Temozolomide in [place], [place] next week.  He and his wife reports  he has completed PT/OT/SP.  He continues to have short term memory loss.  He continues to have some headaches (his dexamethasone has recently been increased).  His wife   Requests refills on oxycodone and Robinul.      Alert/Oriented.   Speech clear.  Appears to have difficulty answering questions.  Motor/Strength 5/5  Gait Steady  Incision well healed.     Todd Fischer returns today for 4 wk f/u.  Overall, he is doing well.  He is scheduled to begin RT with concurrent Temozolomide next week.   His incision is well healed.  I will give him 1 last refill for oxycodone and will refill his Robinul today (Dr. Christene Slates) will continue to prescribe.  I explained to the patient and his wife that neurosurgery will not schedule any further follow ups, but will be glad to see him on an as needed basis.  Dr. Christene Slates and Radiation therapy will schedule and follow his imaging.  He and his wife voiced understanding and agree to above plan.

## 2017-01-11 LAB — CULTURE-FUNGAL,OTHER

## 2017-02-10 ENCOUNTER — Encounter: Admit: 2017-02-10 | Discharge: 2017-02-10

## 2017-02-11 ENCOUNTER — Encounter: Admit: 2017-02-11 | Discharge: 2017-02-11

## 2017-02-11 DIAGNOSIS — C719 Malignant neoplasm of brain, unspecified: Principal | ICD-10-CM

## 2017-02-12 ENCOUNTER — Encounter: Admit: 2017-02-12 | Discharge: 2017-02-12

## 2017-02-16 ENCOUNTER — Encounter: Admit: 2017-02-16 | Discharge: 2017-02-16

## 2017-02-16 NOTE — Telephone Encounter
Scheduling request sent via email for MRI appointment at [facility].

## 2017-02-26 ENCOUNTER — Encounter: Admit: 2017-02-26 | Discharge: 2017-02-27

## 2017-02-26 DIAGNOSIS — R69 Illness, unspecified: Principal | ICD-10-CM

## 2017-03-05 ENCOUNTER — Encounter: Admit: 2017-03-05 | Discharge: 2017-03-05

## 2017-03-05 ENCOUNTER — Encounter: Admit: 2017-03-05 | Discharge: 2017-03-05 | Payer: MEDICARE

## 2017-03-05 DIAGNOSIS — R198 Other specified symptoms and signs involving the digestive system and abdomen: Principal | ICD-10-CM

## 2017-03-05 DIAGNOSIS — C719 Malignant neoplasm of brain, unspecified: Principal | ICD-10-CM

## 2017-03-05 DIAGNOSIS — J989 Respiratory disorder, unspecified: ICD-10-CM

## 2017-03-05 NOTE — Progress Notes
Neuro-oncology Clinic Progress Note:    Date of Service: 03/05/2017    Interval History:  Todd Fischer is seen today in the company of his wife.  Doing well neurologically after completing radiation/temozolomide.          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  On exam, CN 2-12 are intact, strength is 5/5 x 4, sensation is intact as are gait and balance.  Cognition is grossly intact.  ECOG PS = 0 and pain = 0/10.  MRI scan post radiation is reviewed; there is no evidence for tumor persistence/progression at this point.  Discussed 5-day temozolomide adjuvant therapy, discussed typical toxicity.  RTC in 8 weeks for repeat MRI scan, sooner if problems.      I spent a total of 15 minutes today in direct patient evaluation, from 1145 to 1200, with more than 10 minutes spent in treatment plan formulation, symptom management discussion and consultation as outlined above.

## 2017-03-05 NOTE — Progress Notes
Local oncologist is managing temozolomide.  Initiate 5 day temozolomide.  MRI l spine for pain/numbness/weakness in left lower extremity. RTC in 8 weeks with new MRI head w/wo contrast.

## 2017-03-08 ENCOUNTER — Encounter: Admit: 2017-03-08 | Discharge: 2017-03-08

## 2017-03-08 DIAGNOSIS — R69 Illness, unspecified: Principal | ICD-10-CM

## 2017-04-23 ENCOUNTER — Encounter: Admit: 2017-04-23 | Discharge: 2017-04-24

## 2017-04-23 DIAGNOSIS — R69 Illness, unspecified: Principal | ICD-10-CM

## 2017-04-28 ENCOUNTER — Encounter: Admit: 2017-04-28 | Discharge: 2017-04-28

## 2017-04-28 DIAGNOSIS — R69 Illness, unspecified: Principal | ICD-10-CM

## 2017-04-29 ENCOUNTER — Encounter: Admit: 2017-04-29 | Discharge: 2017-04-29

## 2017-04-29 ENCOUNTER — Encounter: Admit: 2017-04-29 | Discharge: 2017-04-29 | Payer: MEDICARE

## 2017-04-29 DIAGNOSIS — R198 Other specified symptoms and signs involving the digestive system and abdomen: Principal | ICD-10-CM

## 2017-04-29 DIAGNOSIS — C719 Malignant neoplasm of brain, unspecified: Principal | ICD-10-CM

## 2017-04-29 DIAGNOSIS — J989 Respiratory disorder, unspecified: ICD-10-CM

## 2017-04-29 NOTE — Progress Notes
Start avastin with local provider.  Patient to schedule appointment Dr Koleen Nimrod for avastin initiation.  RTC in 4 weeks for symptom check and 8 weeks with new MRI head w/wo contrast.

## 2017-04-29 NOTE — Progress Notes
Neuro-oncology Clinic Progress Note:    Date of Service: 04/29/2017    Interval History:  Todd Fischer is seen today in the company of his wife.  He declined in function over the last few weeks, started dex 79m bid earlier this week with some improvement.          Assessment and Plan:    Patient Active Problem List    Diagnosis Date Noted    [place]type [place] 12/04/2016     Priority: High    [place] 12/09[place]2016       Glioblastoma (IV) - BFUTURE YELDELLhas declined clinically despite ongoing 5-day temozolomide x 2 adjuvant cycles.  On exam, despite ongoing dexamethasone 850mbid since Monday, his CN 2-12 are intact with the exception of a dense homonymous left hemianopsia, strength is 5- to 5/5 on the left and 5/5 on the right, sensation is intact as are gait and balance.  Cognition is grossly intact.  ECOG PS = 2 and pain = 0/10.  MRI scan post cycle 2 of 5-day temozolomide is reviewed - there is clear progression of enhancing tumor at the original operative site as well as a new focus of enhancement at the right atrium.  Finally there is asymmetric swelling of the left thalmus (posterior) with no enhancement, non-specific, but worrisome for tumor involvement.  Would stop the 5-day temozolomide adjuvant therapy and start empiric salvage with dose-reduced bevacizumab dosed at 79m98mg IV q2 weeks; I don't think there is a role for surgery here given the multicentric disease findings.  Nor do I think this is pseudoprogression, rather I am comfortable that this is tumor.  Plan RTC in 4 weeks for toxicity check, hopefully off of dexamethasone by then and again in 8 weeks with repeat MRI scan, sooner if problems.      I spent a total of 15 minutes today in direct patient evaluation, from 1145 to 1200, with more than 10 minutes spent in treatment plan formulation, symptom management discussion and consultation as outlined above.

## 2017-05-03 ENCOUNTER — Encounter: Admit: 2017-05-03 | Discharge: 2017-05-03

## 2017-05-03 NOTE — Telephone Encounter
Dr. Koleen Nimrod called to inquire if there are any local therapies or if surgery  Is an option.   The patient was referred back to her for Avastin. Call back numbers listed above. Faxed office note w/ message to call this office with any further questions. Request to office staff to make sure MD received the note today. MD to call w/ further questions after reviewing note

## 2017-05-27 ENCOUNTER — Encounter: Admit: 2017-05-27 | Discharge: 2017-05-27 | Payer: MEDICARE

## 2017-05-27 ENCOUNTER — Encounter: Admit: 2017-05-27 | Discharge: 2017-05-27

## 2017-05-27 DIAGNOSIS — G40909 Epilepsy, unspecified, not intractable, without status epilepticus: ICD-10-CM

## 2017-05-27 DIAGNOSIS — C719 Malignant neoplasm of brain, unspecified: ICD-10-CM

## 2017-05-27 DIAGNOSIS — R198 Other specified symptoms and signs involving the digestive system and abdomen: Principal | ICD-10-CM

## 2017-05-27 DIAGNOSIS — J989 Respiratory disorder, unspecified: ICD-10-CM

## 2017-05-27 NOTE — Progress Notes
RV w Dr. Christene Slates 12/27 with MRI head w/wo contrast 06/17/17 at [facility]

## 2017-05-27 NOTE — Progress Notes
Neuro-oncology Clinic Progress Note:    Date of Service: 05/27/2017    Interval History:  Todd Fischer is seen today in the company of his wife.  He is about the same over the past 4 weeks of ongoing salvage bevacizumab and 68m dexamethasone BID.          Assessment and Plan:    Patient Active Problem List    Diagnosis Date Noted    [place]wildtype [place] 12/04/2016     Priority: High    [place] of lung ([place] 12/09/2016       Glioblastoma (IV) - BDAIVIK OVERLEYis about the same subjectively, on salvage bev x 4 weeks with dexamethasone 466mbid.  On exam, his CN 2-12 are intact with the exception of a dense homonymous left hemianopsia and a very mild left facial weakness, strength is 5- to 5/5 in the left LE, the left UE strength is 5/5 with some slowing noted.  His strength is 5/5 on the right UE/LE, sensation is intact as are gait (slowed) and balance.  Cognition is grossly intact.  ECOG PS = 2 and pain = 0/10.  No new MRI scans for review today.  We discussed pro's/con's of adding temozolomide (metronomic), CCNU or Carbo - we would definitely see more fatigue.  He would prefer to do another 4 weeks of bev/steroids and rediscuss at the scheduled MRI visit in 4 weeks.  Will increase Keppra to 1 1/2 tabs bid (1,125 mg BID) for breakthrough seizures.  Plan RTC in 4 weeks with repeat MRI scan, sooner if problems.      I spent a total of 30 minutes today in direct patient evaluation, from 1000 to 1030, with more than 20 minutes spent in treatment plan formulation, symptom management discussion and consultation as outlined above.

## 2017-06-16 ENCOUNTER — Encounter: Admit: 2017-06-16 | Discharge: 2017-06-16

## 2017-06-17 ENCOUNTER — Encounter: Admit: 2017-06-17 | Discharge: 2017-06-18

## 2017-06-17 DIAGNOSIS — R69 Illness, unspecified: Principal | ICD-10-CM

## 2017-06-18 ENCOUNTER — Encounter: Admit: 2017-06-18 | Discharge: 2017-06-18

## 2017-06-18 DIAGNOSIS — R69 Illness, unspecified: Principal | ICD-10-CM

## 2017-06-18 DIAGNOSIS — C719 Malignant neoplasm of brain, unspecified: Principal | ICD-10-CM

## 2017-06-24 ENCOUNTER — Encounter: Admit: 2017-06-24 | Discharge: 2017-06-24

## 2017-06-24 ENCOUNTER — Encounter: Admit: 2017-06-24 | Discharge: 2017-06-24 | Payer: MEDICARE

## 2017-06-24 DIAGNOSIS — R198 Other specified symptoms and signs involving the digestive system and abdomen: Principal | ICD-10-CM

## 2017-06-24 DIAGNOSIS — C719 Malignant neoplasm of brain, unspecified: ICD-10-CM

## 2017-06-24 DIAGNOSIS — G40909 Epilepsy, unspecified, not intractable, without status epilepticus: ICD-10-CM

## 2017-06-24 DIAGNOSIS — J989 Respiratory disorder, unspecified: ICD-10-CM

## 2017-06-24 DIAGNOSIS — H53462 Homonymous bilateral field defects, left side: ICD-10-CM

## 2017-06-24 MED ORDER — SERTRALINE 100 MG PO TAB
100 mg | ORAL_TABLET | Freq: Every day | ORAL | 3 refills | Status: AC
Start: 2017-06-24 — End: ?

## 2017-06-24 MED ORDER — OXYCODONE 5 MG PO TAB
5-15 mg | ORAL_TABLET | ORAL | 0 refills | 6.00000 days | Status: AC | PRN
Start: 2017-06-24 — End: ?

## 2017-06-24 MED ORDER — PREDNISONE 10 MG PO TAB
10 mg | ORAL_TABLET | Freq: Every day | ORAL | 3 refills | Status: AC
Start: 2017-06-24 — End: 2017-11-03

## 2017-06-24 NOTE — Progress Notes
Neuro-oncology Clinic Progress Note:    Date of Service: 06/24/2017    Interval History:  Todd Fischer is seen today in the company of his wife.  He is doing about the same with mild left hemiparesis, currently undergoing salvage bevacizumab.          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  On exam, his CN 2-12 are intact with the exception of a dense homonymous left hemianopsia and a very mild left facial weakness, strength continues to be 5- to 5/5 in the left LE, the left UE strength is 5/5 with some slowing noted.  His strength is 5/5 on the right UE/LE with some proximal weakness, sensation is intact as are gait (slowed) and balance.  Cognition is grossly intact.  ECOG PS = 2 and pain = 0/10.  I have reviewed his outside MRI scans from 06-17-2017 vs comparison from 04-23-2017 with the patient and his wife today.  There has been the interval modest improvement in both the enhancing and FLAIR hyperintense signal in the right frontal lobe, consistent with expected bevacizumab effect.  No new lesions today.  We discussed pro's/con's of adding temozolomide (metronomic), CCNU or Carbo - we would definitely see more fatigue.  He is comfortable continuing with present salvage bev, will also taper steroids (schedule provided).  Plan RTC in 8 weeks with repeat MRI scan, sooner if problems.      I spent a total of 30 minutes today in direct patient evaluation, from 1130 to 1200, with more than 20 minutes spent in treatment plan formulation, symptom management discussion and consultation as outlined above.

## 2017-08-06 ENCOUNTER — Encounter: Admit: 2017-08-06 | Discharge: 2017-08-06

## 2017-08-08 ENCOUNTER — Encounter: Admit: 2017-08-08 | Discharge: 2017-08-09

## 2017-08-08 DIAGNOSIS — R69 Illness, unspecified: Principal | ICD-10-CM

## 2017-08-24 ENCOUNTER — Encounter: Admit: 2017-08-24 | Discharge: 2017-08-24

## 2017-08-24 ENCOUNTER — Encounter: Admit: 2017-08-24 | Discharge: 2017-08-24 | Payer: MEDICARE

## 2017-08-24 DIAGNOSIS — R69 Illness, unspecified: Principal | ICD-10-CM

## 2017-08-24 DIAGNOSIS — R198 Other specified symptoms and signs involving the digestive system and abdomen: Principal | ICD-10-CM

## 2017-08-24 DIAGNOSIS — C719 Malignant neoplasm of brain, unspecified: ICD-10-CM

## 2017-08-24 DIAGNOSIS — J989 Respiratory disorder, unspecified: ICD-10-CM

## 2017-09-30 ENCOUNTER — Encounter: Admit: 2017-09-30 | Discharge: 2017-09-30

## 2017-09-30 DIAGNOSIS — J989 Respiratory disorder, unspecified: ICD-10-CM

## 2017-09-30 DIAGNOSIS — R198 Other specified symptoms and signs involving the digestive system and abdomen: Principal | ICD-10-CM

## 2017-09-30 DIAGNOSIS — C719 Malignant neoplasm of brain, unspecified: ICD-10-CM

## 2017-10-05 ENCOUNTER — Encounter: Admit: 2017-10-05 | Discharge: 2017-10-06

## 2017-10-15 ENCOUNTER — Encounter: Admit: 2017-10-15 | Discharge: 2017-10-15

## 2017-10-15 DIAGNOSIS — C719 Malignant neoplasm of brain, unspecified: Principal | ICD-10-CM

## 2017-10-21 ENCOUNTER — Encounter: Admit: 2017-10-21 | Discharge: 2017-10-21

## 2017-10-21 ENCOUNTER — Encounter: Admit: 2017-10-21 | Discharge: 2017-10-21 | Payer: MEDICARE

## 2017-10-21 DIAGNOSIS — C719 Malignant neoplasm of brain, unspecified: Principal | ICD-10-CM

## 2017-10-21 DIAGNOSIS — J989 Respiratory disorder, unspecified: ICD-10-CM

## 2017-10-21 DIAGNOSIS — R198 Other specified symptoms and signs involving the digestive system and abdomen: Principal | ICD-10-CM

## 2017-10-28 ENCOUNTER — Encounter: Admit: 2017-10-28 | Discharge: 2017-10-28

## 2017-11-03 ENCOUNTER — Encounter: Admit: 2017-11-03 | Discharge: 2017-11-03

## 2017-11-03 DIAGNOSIS — C719 Malignant neoplasm of brain, unspecified: Principal | ICD-10-CM

## 2017-11-03 MED ORDER — DEXAMETHASONE 2 MG PO TAB
2 mg | ORAL_TABLET | Freq: Every day | ORAL | 3 refills | 12.50000 days | Status: AC
Start: 2017-11-03 — End: ?

## 2017-11-20 ENCOUNTER — Encounter: Admit: 2017-11-20 | Discharge: 2017-11-20

## 2017-11-20 MED ORDER — DIVALPROEX 500 MG PO TBEC
500 mg | ORAL_TABLET | Freq: Three times a day (TID) | ORAL | 3 refills | 30.00000 days | Status: AC
Start: 2017-11-20 — End: ?

## 2017-12-16 ENCOUNTER — Encounter: Admit: 2017-12-16 | Discharge: 2017-12-17

## 2017-12-17 ENCOUNTER — Encounter: Admit: 2017-12-17 | Discharge: 2017-12-17

## 2017-12-17 DIAGNOSIS — C719 Malignant neoplasm of brain, unspecified: Principal | ICD-10-CM

## 2017-12-23 ENCOUNTER — Encounter: Admit: 2017-12-23 | Discharge: 2017-12-23

## 2017-12-23 ENCOUNTER — Encounter: Admit: 2017-12-23 | Discharge: 2017-12-23 | Payer: MEDICARE

## 2017-12-23 DIAGNOSIS — C719 Malignant neoplasm of brain, unspecified: Principal | ICD-10-CM

## 2017-12-23 DIAGNOSIS — R29810 Facial weakness: ICD-10-CM

## 2017-12-23 DIAGNOSIS — G8194 Hemiplegia, unspecified affecting left nondominant side: ICD-10-CM

## 2017-12-23 DIAGNOSIS — R198 Other specified symptoms and signs involving the digestive system and abdomen: Principal | ICD-10-CM

## 2017-12-23 DIAGNOSIS — J989 Respiratory disorder, unspecified: ICD-10-CM

## 2017-12-23 MED ORDER — MORPHINE CONCENTRATE 100 MG/5 ML (20 MG/ML) PO SOLN
5 mg | ORAL | 0 refills | 7.00000 days | Status: AC | PRN
Start: 2017-12-23 — End: ?

## 2018-01-27 DEATH — deceased

## 2018-08-24 IMAGING — CT Thorax^1_Chest_Abd_Pelvis_WITH (Adult)
1 series · 14 of 32 positions shown, 17 images · IV contrast (APPLIED)
Comparison: none

[Series 2: chest 5.0 soft tissue · axial · 0.77mm/px · z∈[-275,-40]mm · 14 of 53 slices shown, 17 images]
[im 4/53  soft-tissue]
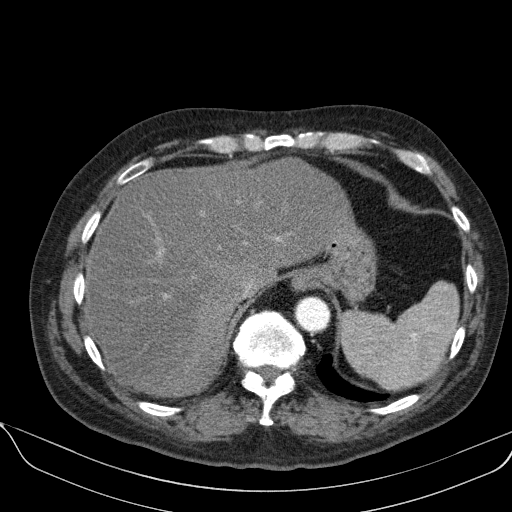
[im 4/53  bone]
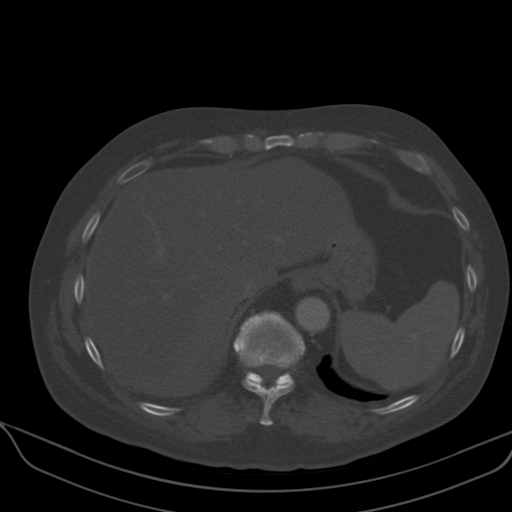
[im 9/53  soft-tissue]
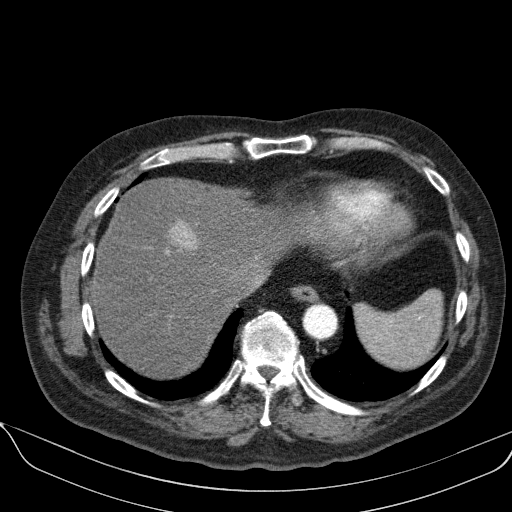
[im 12/53  soft-tissue]
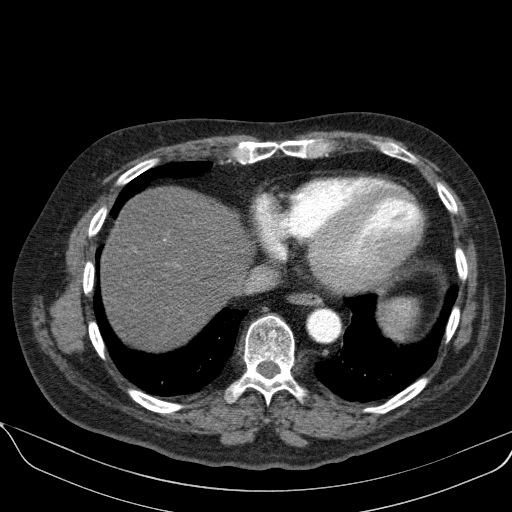
[im 17/53  soft-tissue]
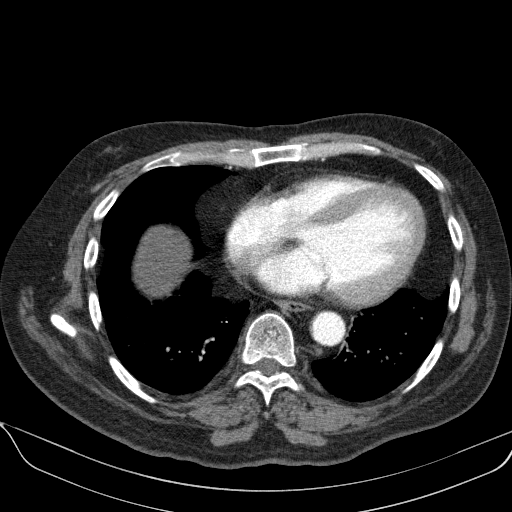
[im 22/53  soft-tissue]
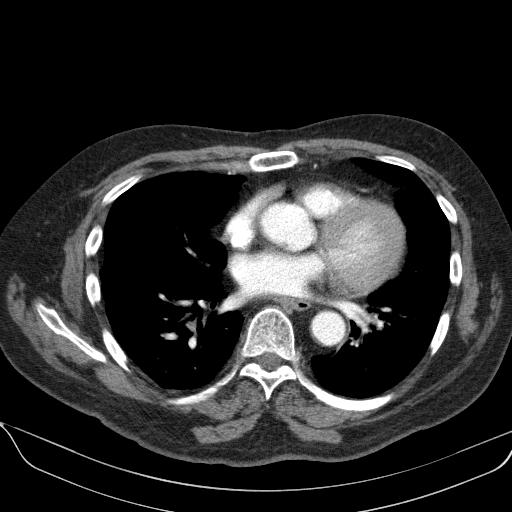
[im 27/53  soft-tissue]
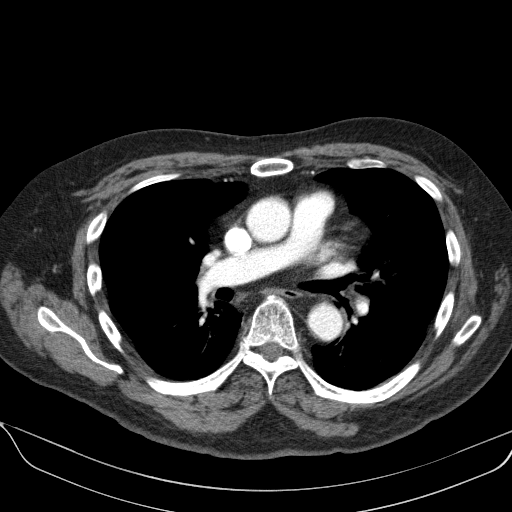
[im 31/53  soft-tissue]
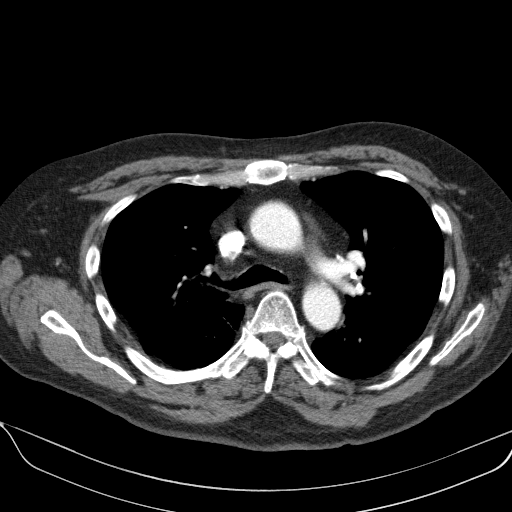
[im 36/53  soft-tissue]
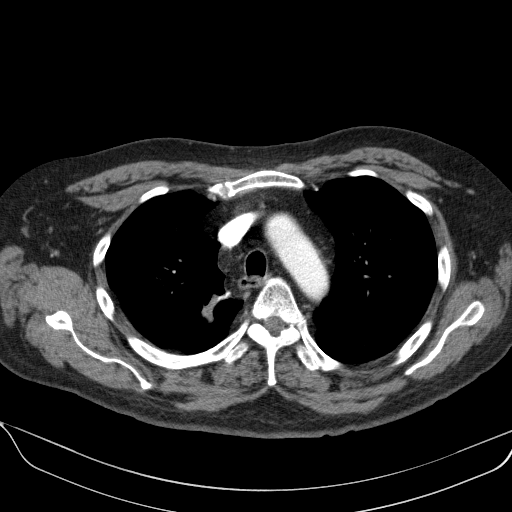
[im 41/53  soft-tissue]
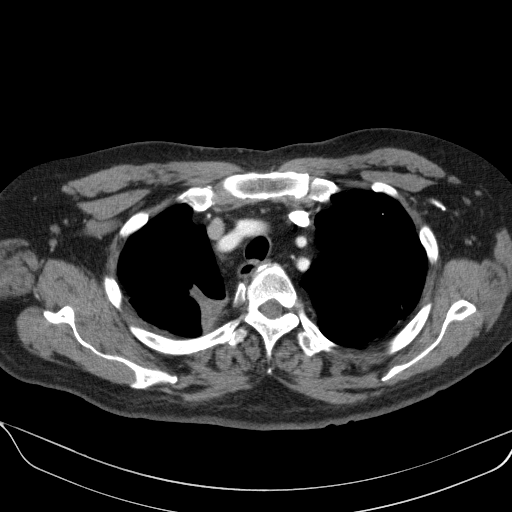
[im 41/53  bone]
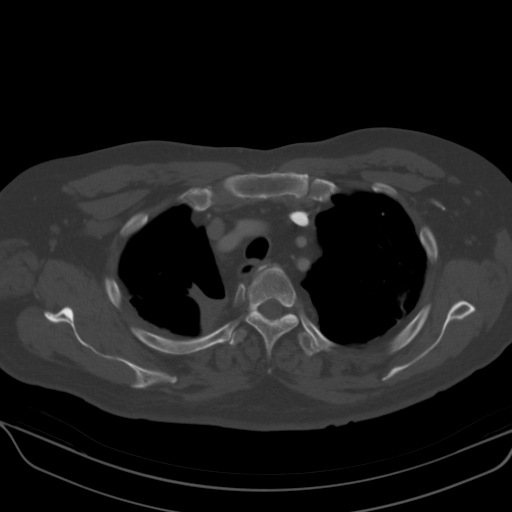
[im 44/53  soft-tissue]
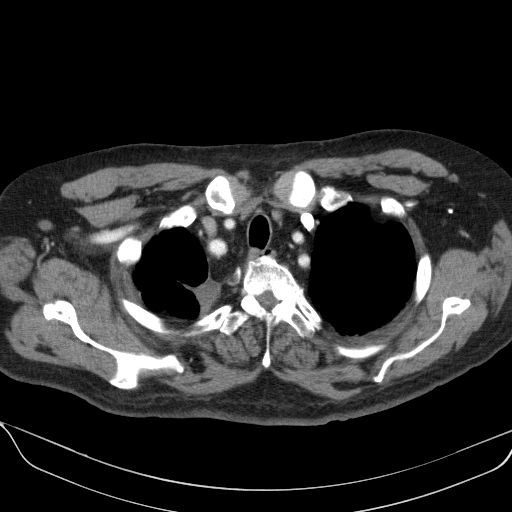
[im 46/53  lung]
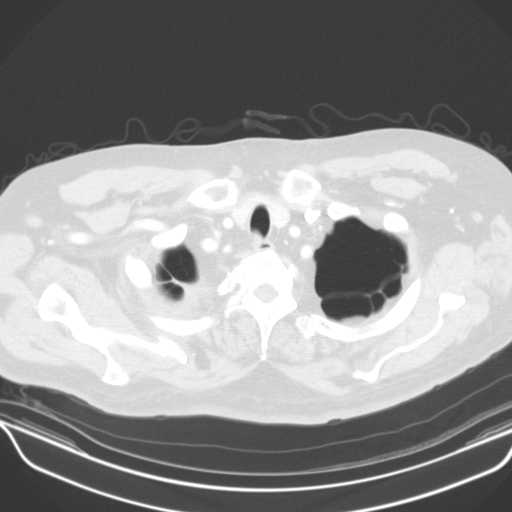
[im 47/53  lung]
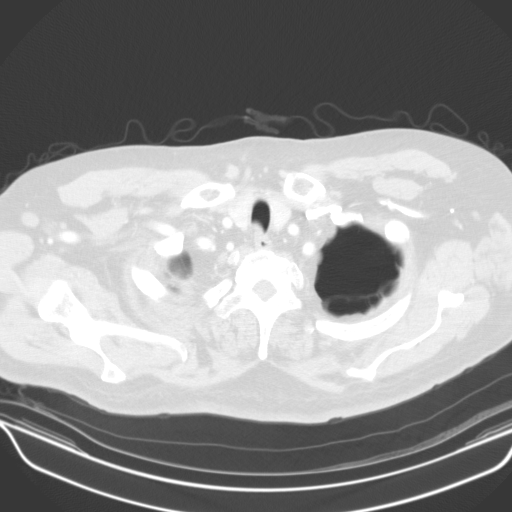
[im 49/53  soft-tissue]
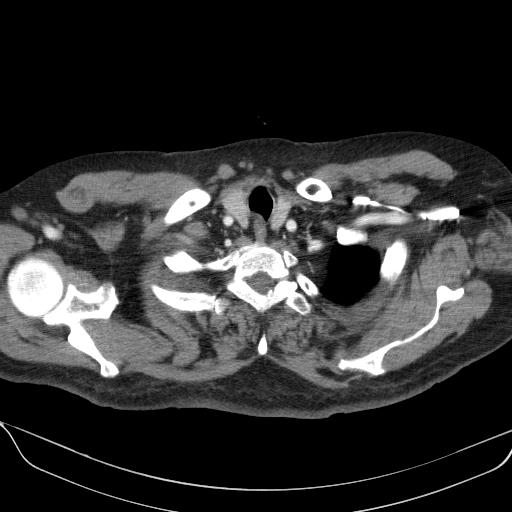
[im 49/53  lung]
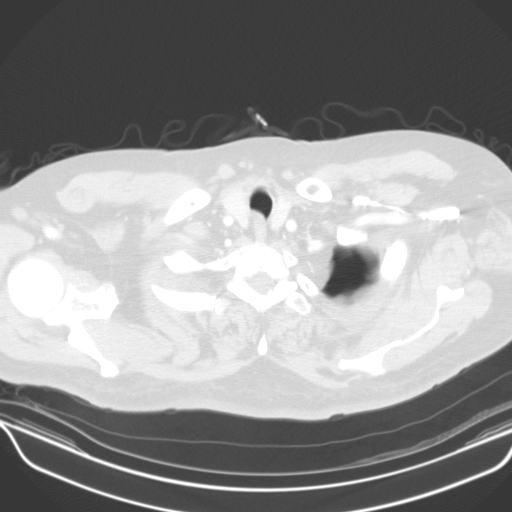
[im 51/53  lung]
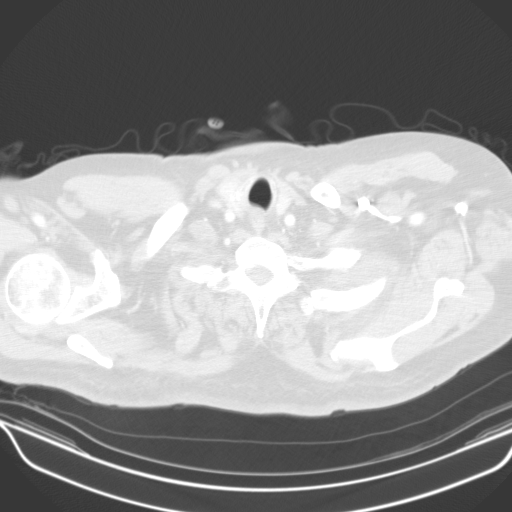

[14 of 32 positions shown; findings below may reference images not displayed]

DIAGNOSTIC STUDIES

CT ABDOMEN AND PELVIS WITH CONTRAST

EXAM

CT CHEST, ABDOMEN AND PELVIS WITH CONTRAST

INDICATION

MALIGNANT NEOPLASM OF BREAIN
PT C/O MALIGNANT NEOPLASM OF BRAIN. PT C/O ABD PAIN, BLOATING. GFR 90.
KTTIG [id number]. TJ

TECHNIQUE

Helical CT of the chest, abdomen and pelvis is performed after the dynamic administration of 100 cc
's of Omnipaque 300. No oral contrast is given. Sagittal and coronal reconstruction images are
obtained.

All CT scans at this facility use dose modulation, iterative reconstruction, and/or weight based
dosing when appropriate to reduce radiation dose to as low as reasonably achievable.

COMPARISONS

Chest x-ray 11/20/2016

FINDINGS

CHEST:
A [DATE] X 3.7 X 2.7 (cc, transverse, and AP dimensions respectively) mass is present in the
posterior medial right upper lobe abutting the chest wall and mediastinum. This is hypodense with
CT Hounsfield units less than 20. Although this is irregularly shaped, the margins are not
spiculated. Bronchogenic neoplasm cannot be excluded. Clinical correlation is recommended and if
indicated further evaluation with CT PET or biopsy could be performed.

Biapical pleural parenchymal scar with significant emphysematous changes with multiple bulla/blebs
are present. There is suggestion of nodular thickening of the left major fissure versus subpleural
nodularity measuring 0.7 cm (best seen on series 8, image 29 and series 6, image 31). Accentuation
of the lung markings is present. No focal consolidation is identified.

No pericardial nor pleural effusions are present. Shotty mediastinal and axillary nodes not
pathologic by CT size criterion are identified. Vascular tortuosity and calcification is present.
Bony degenerative changes are present.

ABDOMEN AND PELVIS:
A 2.6 x 2.3 cm oval hyperdense mass is present in the hepatic dome (maximum dimension is 2.8 cm).
Hypervascular neoplasm, metastatic, or primary cannot be excluded. A small, 1.1 cm, hypodense mass
is present in the medial segment of the left lobe of the liver (series 1, image 19). Neoplasm, more
likely metastatic, cannot be excluded. The liver has diffusely decreased density with minimal
peripheral sparing of the right lobe posteriorly towards the dome. No additional hepatic
parenchymal abnormalities are identified. The spleen, adrenal glands, pancreas, and gallbladder
have a normal CT appearance. A 1.6 cm hypodensity is present in the upper pole of the right kidney.
CT Hounsfield units are compatible with a simple cyst. The kidneys otherwise have a normal CT
appearance.

Mild atherosclerotic vascular calcification is present. The aorta is tortuous. Mild ectasia of the
left common iliac artery is present which measures 1.5 cm in maximum dimension. Vascular structures
otherwise have a normal CT appearance. Shotty nodes are noted. No adenopathy pathologic by size
criteria is identified in the retroperitoneal, iliac, or inguinal regions.

A moderate amount of colonic content is present. The colon is redundant. The visualized bowel is
unremarkable accounting for the lack of oral contrast. No free abdominal nor free pelvic fluid is
present. The prostate gland has a grossly normal CT appearance. Seminal vesicles are symmetric.
Spondylitic changes are noted. Spondylitic changes are present.

IMPRESSION
1. 4.4 x 3.7 x 2 7 cm irregularly contoured hypodense mass in the right posterior medial upper
lobe. Bronchogenic neoplasm cannot be excluded. If clinically indicated further evaluation with CT
could be performed.
[DATE] centimeter hyperdense hepatic mass. Hypervascular neoplasm, primary metastatic cannot be
excluded. In addition, a 1.1 cm hepatic hypodensity is also identified. Metastatic disease cannot
be excluded.
3. 0.7 cm subpleural versus pleural nodularity as described above.
4. Fatty infiltration of the liver. Severe biapical emphysematous changes with bulla/blebs and
scarring.
